# Patient Record
Sex: Female | Born: 1965 | Race: White | Hispanic: No | Smoking: Never smoker
Health system: Southern US, Community
[De-identification: ages and names within clinical notes are randomized; demographics above are authoritative.]

## PROBLEM LIST (undated history)

## (undated) DIAGNOSIS — T8859XA Other complications of anesthesia, initial encounter: Secondary | ICD-10-CM

## (undated) DIAGNOSIS — F32A Depression, unspecified: Secondary | ICD-10-CM

## (undated) DIAGNOSIS — N189 Chronic kidney disease, unspecified: Secondary | ICD-10-CM

## (undated) DIAGNOSIS — K219 Gastro-esophageal reflux disease without esophagitis: Secondary | ICD-10-CM

## (undated) DIAGNOSIS — Z8489 Family history of other specified conditions: Secondary | ICD-10-CM

## (undated) DIAGNOSIS — R011 Cardiac murmur, unspecified: Secondary | ICD-10-CM

## (undated) DIAGNOSIS — T4145XA Adverse effect of unspecified anesthetic, initial encounter: Secondary | ICD-10-CM

## (undated) DIAGNOSIS — C801 Malignant (primary) neoplasm, unspecified: Secondary | ICD-10-CM

## (undated) DIAGNOSIS — R51 Headache: Secondary | ICD-10-CM

## (undated) DIAGNOSIS — F329 Major depressive disorder, single episode, unspecified: Secondary | ICD-10-CM

## (undated) HISTORY — PX: BREAST SURGERY: SHX581

## (undated) HISTORY — PX: KIDNEY STONE SURGERY: SHX686

## (undated) HISTORY — PX: DIAGNOSTIC LAPAROSCOPY: SUR761

---

## 1999-07-21 ENCOUNTER — Other Ambulatory Visit: Admission: RE | Admit: 1999-07-21 | Discharge: 1999-07-21 | Payer: Self-pay | Admitting: Obstetrics and Gynecology

## 2000-09-04 ENCOUNTER — Other Ambulatory Visit: Admission: RE | Admit: 2000-09-04 | Discharge: 2000-09-04 | Payer: Self-pay | Admitting: Obstetrics and Gynecology

## 2001-10-09 ENCOUNTER — Other Ambulatory Visit: Admission: RE | Admit: 2001-10-09 | Discharge: 2001-10-09 | Payer: Self-pay | Admitting: Obstetrics and Gynecology

## 2001-10-17 ENCOUNTER — Encounter: Admission: RE | Admit: 2001-10-17 | Discharge: 2001-10-17 | Payer: Self-pay | Admitting: Obstetrics and Gynecology

## 2001-10-17 ENCOUNTER — Encounter: Payer: Self-pay | Admitting: Obstetrics and Gynecology

## 2001-12-14 ENCOUNTER — Encounter (INDEPENDENT_AMBULATORY_CARE_PROVIDER_SITE_OTHER): Payer: Self-pay | Admitting: Specialist

## 2001-12-14 ENCOUNTER — Ambulatory Visit (HOSPITAL_BASED_OUTPATIENT_CLINIC_OR_DEPARTMENT_OTHER): Admission: RE | Admit: 2001-12-14 | Discharge: 2001-12-14 | Payer: Self-pay | Admitting: *Deleted

## 2002-11-25 ENCOUNTER — Other Ambulatory Visit: Admission: RE | Admit: 2002-11-25 | Discharge: 2002-11-25 | Payer: Self-pay | Admitting: Obstetrics and Gynecology

## 2003-12-23 ENCOUNTER — Other Ambulatory Visit: Admission: RE | Admit: 2003-12-23 | Discharge: 2003-12-23 | Payer: Self-pay | Admitting: Obstetrics and Gynecology

## 2005-01-14 ENCOUNTER — Other Ambulatory Visit: Admission: RE | Admit: 2005-01-14 | Discharge: 2005-01-14 | Payer: Self-pay | Admitting: Obstetrics and Gynecology

## 2006-03-03 ENCOUNTER — Other Ambulatory Visit: Admission: RE | Admit: 2006-03-03 | Discharge: 2006-03-03 | Payer: Self-pay | Admitting: Obstetrics and Gynecology

## 2008-05-22 ENCOUNTER — Encounter: Admission: RE | Admit: 2008-05-22 | Discharge: 2008-05-22 | Payer: Self-pay | Admitting: Obstetrics and Gynecology

## 2008-12-25 ENCOUNTER — Encounter: Admission: RE | Admit: 2008-12-25 | Discharge: 2008-12-25 | Payer: Self-pay | Admitting: Obstetrics and Gynecology

## 2009-01-08 ENCOUNTER — Ambulatory Visit (HOSPITAL_COMMUNITY): Admission: RE | Admit: 2009-01-08 | Discharge: 2009-01-08 | Payer: Self-pay | Admitting: Obstetrics and Gynecology

## 2009-01-26 ENCOUNTER — Ambulatory Visit (HOSPITAL_COMMUNITY): Admission: RE | Admit: 2009-01-26 | Discharge: 2009-01-26 | Payer: Self-pay | Admitting: Urology

## 2009-05-28 ENCOUNTER — Encounter: Admission: RE | Admit: 2009-05-28 | Discharge: 2009-05-28 | Payer: Self-pay | Admitting: Obstetrics and Gynecology

## 2009-06-10 ENCOUNTER — Ambulatory Visit (HOSPITAL_COMMUNITY): Admission: RE | Admit: 2009-06-10 | Discharge: 2009-06-10 | Payer: Self-pay | Admitting: Obstetrics and Gynecology

## 2009-06-10 ENCOUNTER — Encounter (INDEPENDENT_AMBULATORY_CARE_PROVIDER_SITE_OTHER): Payer: Self-pay | Admitting: Obstetrics and Gynecology

## 2009-08-19 ENCOUNTER — Encounter: Payer: Self-pay | Admitting: Gastroenterology

## 2009-08-19 ENCOUNTER — Encounter: Payer: Self-pay | Admitting: Nurse Practitioner

## 2009-09-24 ENCOUNTER — Ambulatory Visit (HOSPITAL_BASED_OUTPATIENT_CLINIC_OR_DEPARTMENT_OTHER): Admission: RE | Admit: 2009-09-24 | Discharge: 2009-09-24 | Payer: Self-pay | Admitting: Urology

## 2010-01-07 ENCOUNTER — Encounter: Payer: Self-pay | Admitting: Nurse Practitioner

## 2010-01-14 ENCOUNTER — Encounter: Payer: Self-pay | Admitting: Nurse Practitioner

## 2010-01-14 ENCOUNTER — Telehealth: Payer: Self-pay | Admitting: Internal Medicine

## 2010-01-15 ENCOUNTER — Ambulatory Visit: Payer: Self-pay | Admitting: Gastroenterology

## 2010-01-15 DIAGNOSIS — R142 Eructation: Secondary | ICD-10-CM

## 2010-01-15 DIAGNOSIS — R1012 Left upper quadrant pain: Secondary | ICD-10-CM

## 2010-01-15 DIAGNOSIS — R143 Flatulence: Secondary | ICD-10-CM

## 2010-01-15 DIAGNOSIS — R141 Gas pain: Secondary | ICD-10-CM

## 2010-01-20 ENCOUNTER — Ambulatory Visit: Payer: Self-pay | Admitting: Gastroenterology

## 2010-01-22 ENCOUNTER — Encounter: Payer: Self-pay | Admitting: Gastroenterology

## 2010-02-17 ENCOUNTER — Ambulatory Visit: Payer: Self-pay | Admitting: Gastroenterology

## 2010-05-06 IMAGING — MG MM DIAGNOSTIC UNILATERAL R
4 series · 4 of 4 positions shown · non-contrast
Comparison: Mammogram March 26, 2007,May 12, 2008 and May 23, 2019
[id number]

CLINICAL DATA: 6-month follow-up of calcifications in the right
breast

DIGITAL DIAGNOSTIC RIGHT MAMMOGRAM WITH CAD

[R CC (1 of 2)]
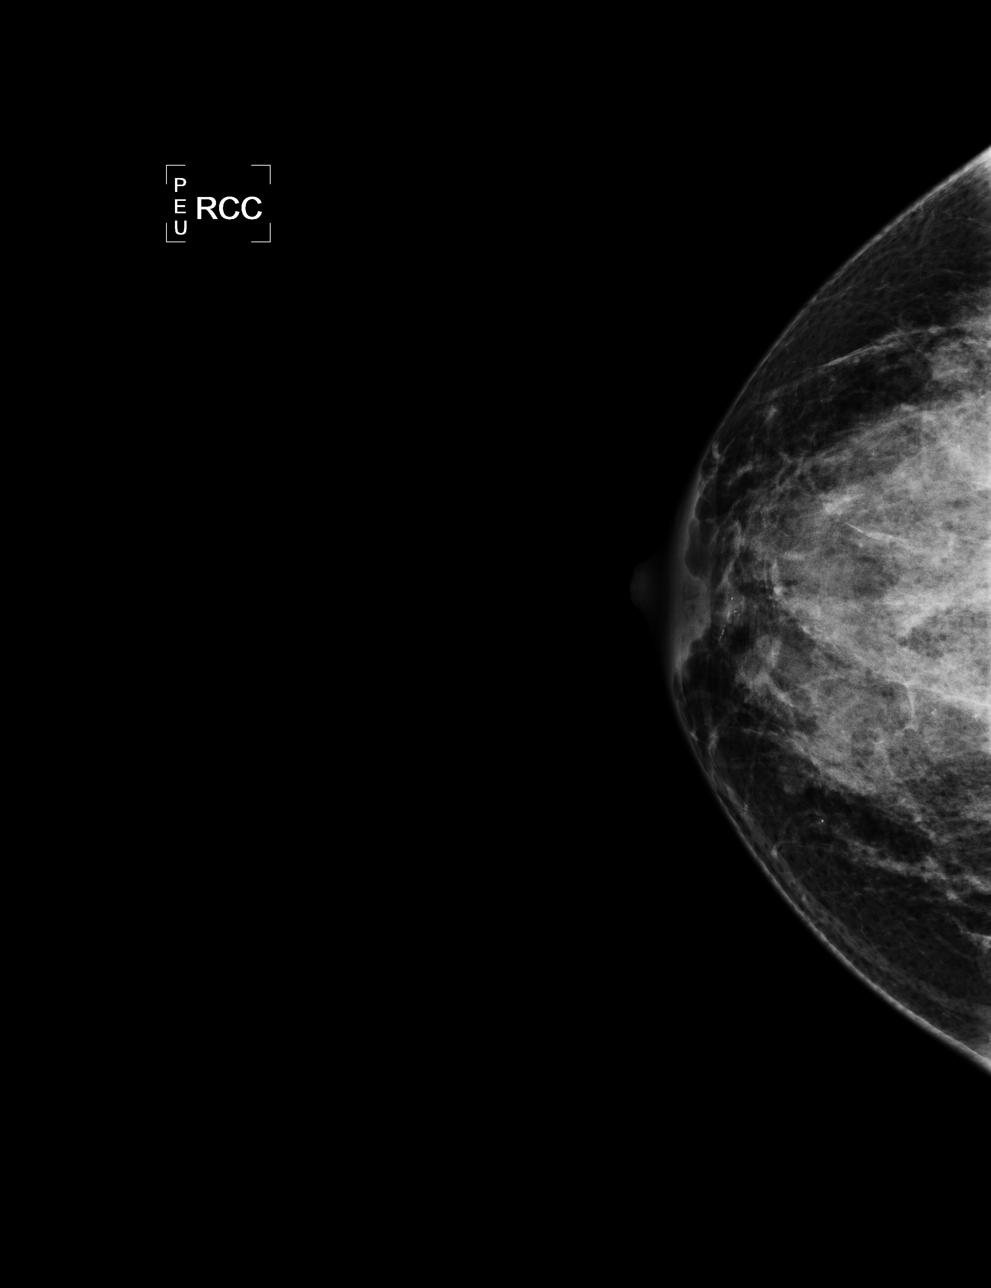

[R MLO]
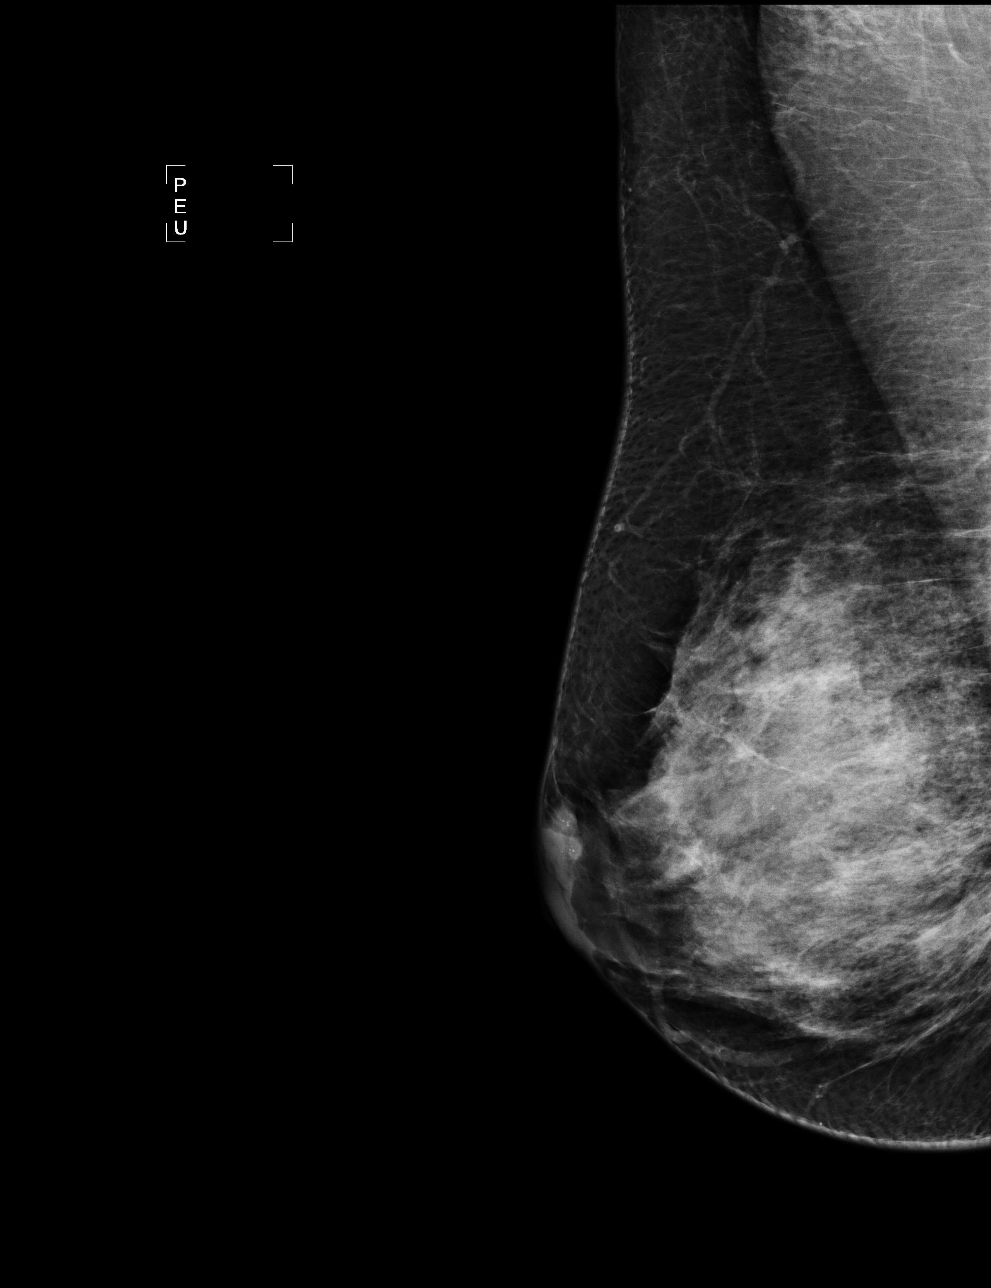

[R CC (2 of 2)]
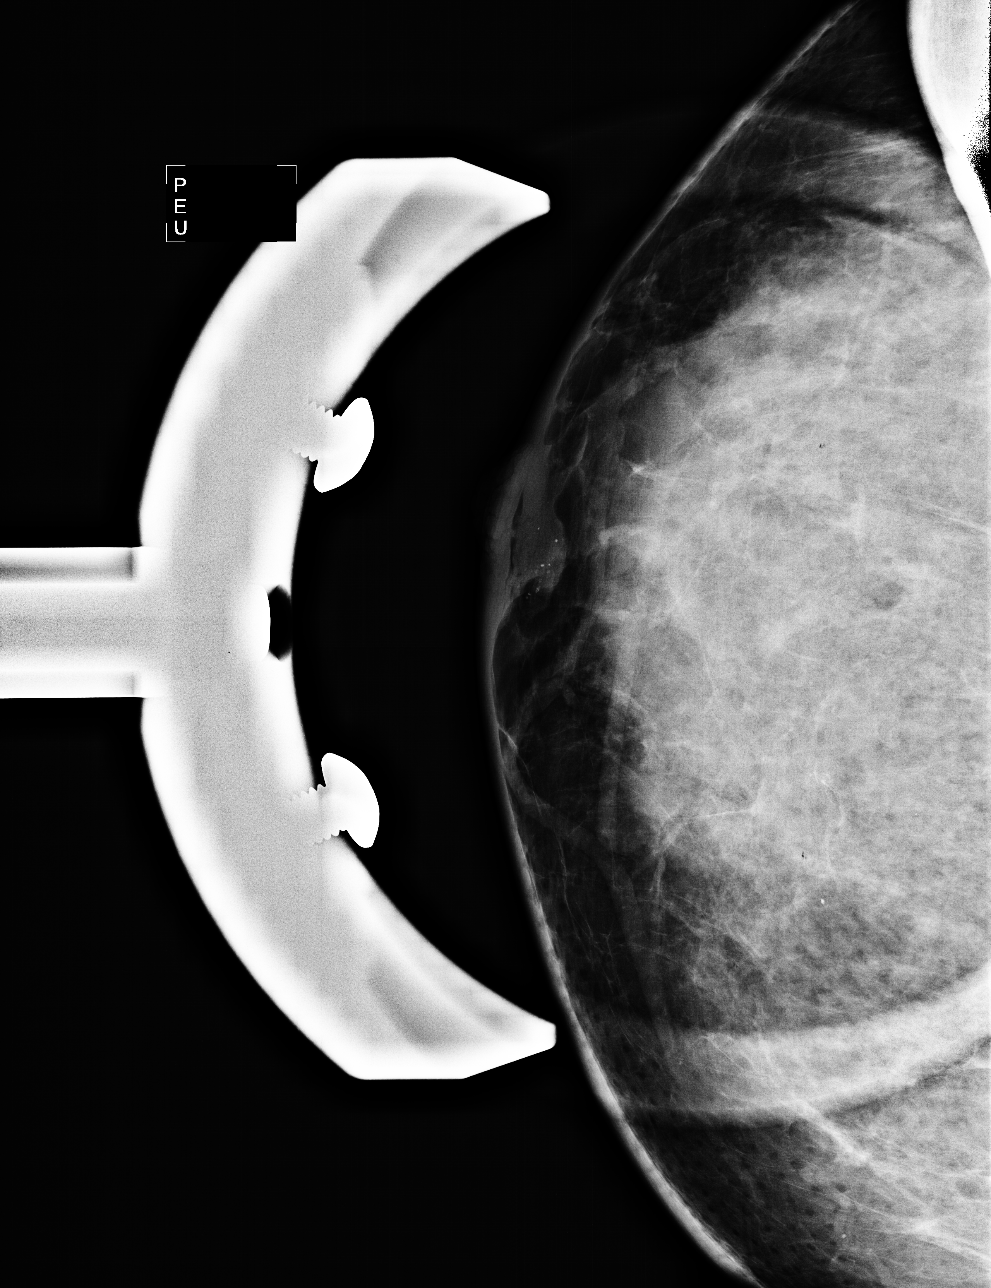

[R ML]
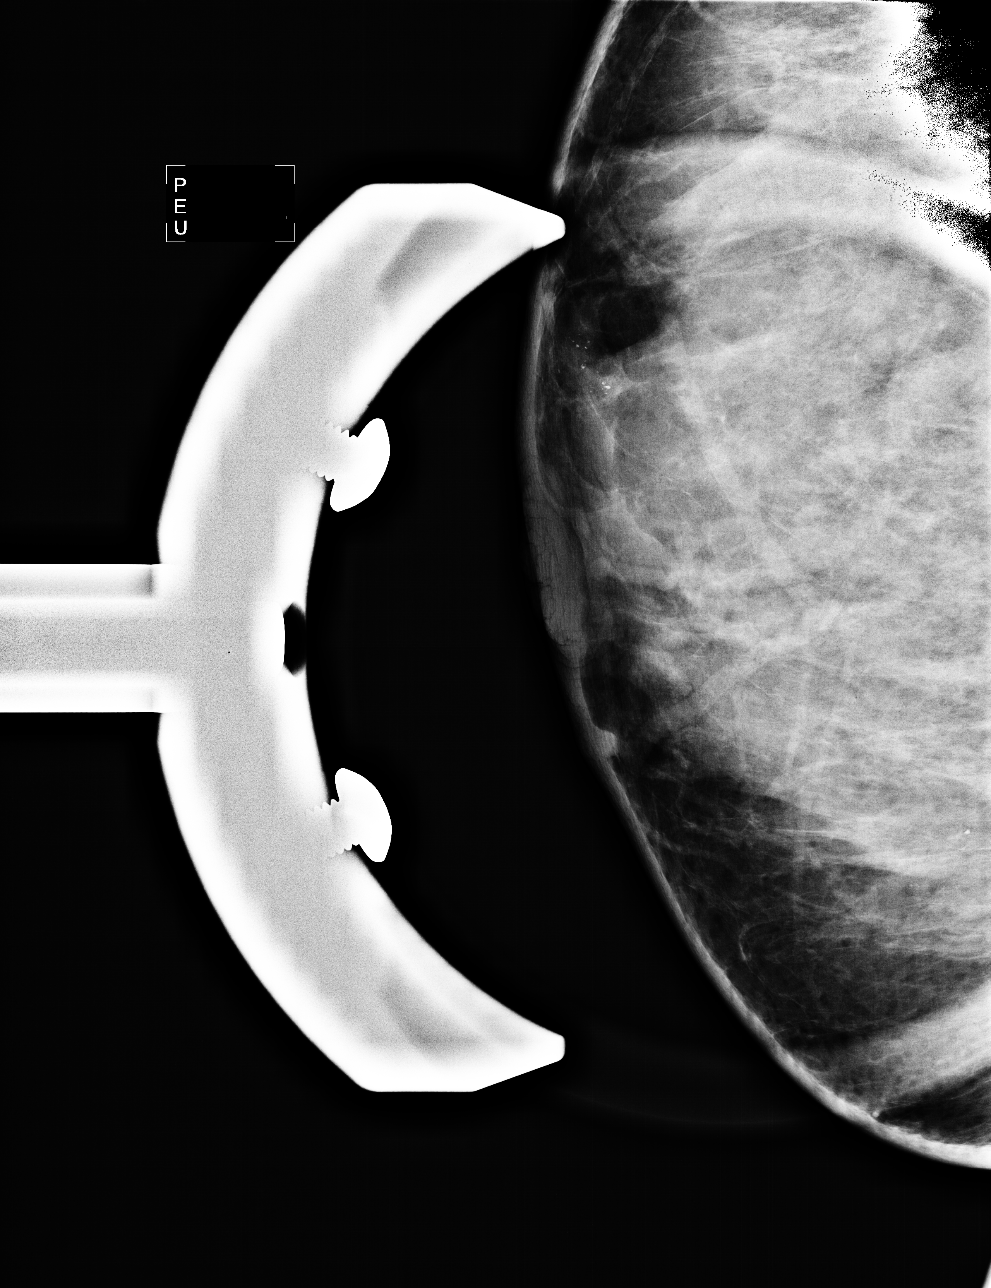

[4 of 4 positions shown; findings below may reference images not displayed]

FINDINGS: Whole breast CC and MLO views of the right breast show
extremely dense breast parenchyma without evidence of mass or
architectural distortion.  Calcifications in the subareolar right
breast are evaluated with spot magnification views and are stable
in appearance and number.  These calcifications are coarse and some
of them have lucent centers.
IMPRESSION: Stable probably benign calcifications in the subareolar right
breast. Suggest bilateral diagnostic mammogram in May 2009 with
magnification views of the right breast to complete a full year
follow-up.

BI-RADS CATEGORY 3:  Probably benign finding(s) - short interval
follow-up suggested.

REF:Z9 DICTATED: 12/25/2008 [DATE]

## 2010-12-16 NOTE — Progress Notes (Signed)
Summary: Triage  Phone Note From Other Clinic   Caller: [facility] Dr. Clelia Croft  [phone] Summary of Call: Pt has Dyspepsia and Dr. Clelia Croft is requesting Dr. Juanda Chance and she does not have any appts. available Initial call taken by: Karna Christmas,  January 14, 2010 12:39 PM  Follow-up for Phone Call        Patient  is scheduled to see Willette Cluster RNP 01-15-10 2:30.  Malachi Bonds will send notes and notify the patient Follow-up by: Darcey Nora RN, CGRN,  January 14, 2010 2:52 PM

## 2010-12-16 NOTE — Procedures (Signed)
Summary: Upper Endoscopy  Patient: [name] Note: All result statuses are Final unless otherwise noted.  Tests: (1) Upper Endoscopy (EGD)   EGD Upper Endoscopy       DONE     [facility]     [address]     [place], [place]  [address]           ENDOSCOPY PROCEDURE REPORT           PATIENT:  [name], [name]  MR#:  [id number]     BIRTHDATE:  [birth year], 43 yrs. old  GENDER:  female           ENDOSCOPIST:  Barbette Hair. Arlyce Dice, MD     Referred by:           PROCEDURE DATE:  01/20/2010     PROCEDURE:  EGD with biopsy     ASA CLASS:  Class I     INDICATIONS:  abdominal pain           MEDICATIONS:   Fentanyl 50 mcg IV, Versed 5 mg IV, Benadryl 25 mg     IV, glycopyrrolate (Robinal) 0.2 mg IV, 0.6cc simethancone 0.6 cc     PO     TOPICAL ANESTHETIC:  Exactacain Spray           DESCRIPTION OF PROCEDURE:   After the risks benefits and     alternatives of the procedure were thoroughly explained, informed     consent was obtained.  The LB GIF-H180 [id number] endoscope was     introduced through the mouth and advanced to the third portion of     the duodenum, without limitations.  The instrument was slowly     withdrawn as the mucosa was fully examined.     <<PROCEDUREIMAGES>>           Duodenitis was found. Enlarged folds in bulb. Bxs taken (see     image3).  Mild gastritis was found at the gastroesophageal     junction. Edematous mucosa gastric side of GE junction. Bxs taken     (see image9).  A sessile polyp was found in the fundus (see     image7). Single 4mm sessile polyp with normal appearing mucosa     Otherwise the examination was normal (see image1, image4, image5,     image6, and image12).    Retroflexed views revealed no     abnormalities.    The scope was then withdrawn from the patient     and the procedure completed.           COMPLICATIONS:  None           ENDOSCOPIC IMPRESSION:     1) Duodenitis     2) Mild gastritis at the gastroesophageal junction     3)  Sessile polyp in the fundus     4) Otherwise normal examination           Findings are likely incidental to complaints of abdominal pain,     which appears musculoskeletal           RECOMMENDATIONS:     1) Await biopsy results     2) continue PPI - nexium     3) trial of clinoril 200mg  bid for 10 days     3) Call office next 2-3 days to schedule an office appointment     for 2 weeks           REPEAT EXAM:  No  ______________________________     Barbette Hair Arlyce Dice, MD           CC:  Kari Baars, MD           n.     eSIGNED:   Barbette Hair. Kaplan at 01/20/2010 11:28 AM           Toula Moos, [id number]  Note: An exclamation mark (!) indicates a result that was not dispersed into the flowsheet. Document Creation Date: 01/20/2010 11:28 AM _______________________________________________________________________  (1) Order result status: Final Collection or observation date-time: 01/20/2010 11:19 Requested date-time:  Receipt date-time:  Reported date-time:  Referring Physician:   Ordering Physician: Melvia Heaps [phone]) Specimen Source:  Source: Launa Grill Order Number: [id number] Lab site:

## 2010-12-16 NOTE — Assessment & Plan Note (Signed)
Summary: dyspepsia/[name]   History of Present Illness Visit Type: consult  Primary GI MD: Melvia Heaps MD [facility] Primary Provider: Buren Kos, MD Requesting Provider: Buren Kos, MD Chief Complaint: Left upper side pain, belching, and bloating  History of Present Illness:   New to this practice. Presents with one year history of pressure under left rib cage which can sometimes can be quite painful. Aware of discomfort at all time, it is not exacerbated by meals, worse when stationary for prolonged periods of time. She has recently developed problems with excessive belching. Patient has history of nephrolithiasis, she originally thought discomfort secondary to that. Underwent lithotripsy twice, last time in November. Still has discomfort and urology recommended she see her PCP and also consider GI evaluation. Recent CXR and labs were okay. No nausea, weight stable.    GI Review of Systems    Reports abdominal pain, belching, and  bloating.     Location of  Abdominal pain: left side.    Denies acid reflux, chest pain, dysphagia with liquids, dysphagia with solids, heartburn, loss of appetite, nausea, vomiting, vomiting blood, weight loss, and  weight gain.        Denies anal fissure, black tarry stools, change in bowel habit, constipation, diarrhea, diverticulosis, fecal incontinence, heme positive stool, hemorrhoids, irritable bowel syndrome, jaundice, light color stool, liver problems, rectal bleeding, and  rectal pain.    Current Medications (verified): 1)  Multivitamins   Tabs (Multiple Vitamin) .... One Tablet By Mouth Once Daily 2)  Calcium 1500 Mg Tabs (Calcium Carbonate) .... One Tablet By Mouth Once Daily 3)  Vicodin 5-500 Mg Tabs (Hydrocodone-Acetaminophen) .... One Tablet By Mouth As Needed For Kidney Stones Pain 4)  Oxycodone-Acetaminophen 5-325 Mg Tabs (Oxycodone-Acetaminophen) .... One Tablet By Mouth As Needed For Kidney Stones Pain  Allergies (verified): 1)  ! Sulfa 2)   ! Macrobid 3)  ! Aspirin 4)  ! Flomax  Past History:  Past Medical History: Chronic Headaches Kidney Stones  Past Surgical History: Bilateral Breast Biopsy Kidney Stone Surgery  Family History: Family History of Colon Polyps:Mother  Family History of Colon Cancer: ? 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  Alcohol Use - no Daily Caffeine Use: 1/2 daily  Illicit Drug Use - no Smoking Status:  never Drug Use:  no  Review of Systems       The patient complains of back pain, fatigue, headaches-new, and sleeping problems.  The patient denies allergy/sinus, anemia, anxiety-new, arthritis/joint pain, blood in urine, breast changes/lumps, change in vision, confusion, cough, coughing up blood, depression-new, fainting, fever, hearing problems, heart murmur, heart rhythm changes, itching, menstrual pain, muscle pains/cramps, night sweats, nosebleeds, pregnancy symptoms, shortness of breath, skin rash, sore throat, swelling of feet/legs, swollen lymph glands, thirst - excessive , urination - excessive , urination changes/pain, urine leakage, vision changes, and voice change.    Vital Signs:  Patient profile:   45 year old female Height:      64 inches Weight:      158 pounds BMI:     27.22 BSA:     1.77 Pulse rate:   72 / minute Pulse rhythm:   regular BP sitting:   124 / 80  (left arm) Cuff size:   regular  Vitals Entered By: Ok Anis CMA (January 15, 2010 2:22 PM)  Physical Exam  General:  Well developed, well nourished, no acute distress. Head:  Normocephalic and atraumatic. Eyes:  Conjunctiva pink, no icterus.  Mouth:  No oral lesions. Tongue moist.  Neck:  no obvious masses  Lungs:  Clear throughout to auscultation. Heart:  Regular rate and rhythm; no murmurs, rubs,  or bruits. Abdomen:  Abdomen soft, nontender, nondistended. No obvious masses or  hepatomegaly.Normal bowel sounds.  Msk:  Symmetrical with no gross deformities. Normal posture. Extremities:  No palmar erythema, no edema.  Neurologic:  Alert and  oriented x4;  grossly normal neurologically. Skin:  Intact without significant lesions or rashes. Cervical Nodes:  No significant cervical adenopathy. Psych:  Alert and cooperative. Normal mood and affect.   Impression & Recommendations:  Problem # 1:  LUQ PAIN (ICD-789.02) One year history of LUQ pressure, present almost on a constant basis. She has recently developed problems with belching as well. Her discomfort doesn't seem GI related. Sounds more musculoskeletal, neuropathic in nature but she has suffered with it for a year now. Belching may or may not be related to LUQ discomfort.  For further evaluation the patient will be scheduled for an EGD with biopsies ( if indicated).  The risks and benefits of the procedure, as well as alternatives were discussed with the patient and she agrees to proceed. Trial of a daily PPI. If EGD negative, consider imaging.    Orders: EGD (EGD)  Patient Instructions: 1)  We have given you Nexium samples. Take 1 capsule 30 min prior to breakfast. 2)  We scheduled the Endoscopy for 01-20-10 with Dr. Arlyce Dice.  3)  [facility] Patient Information Guide given to patient. 4)  Endoscopy brochure provided. 5)  Copy sent to : Buren Kos, MD 6)  The medication list was reviewed and reconciled.  All changed / newly prescribed medications were explained.  A complete medication list was provided to the patient / caregiver.

## 2010-12-16 NOTE — Letter (Signed)
Summary: [facility]  [facility]   Imported By: Lester [place] 02/09/2010 11:36:50  _____________________________________________________________________  External Attachment:    Type:   Image     Comment:   External Document

## 2010-12-16 NOTE — Letter (Signed)
Summary: Results Letter  [facility]  [address] [place], [place] [address]   Phone: [phone]  Fax: [phone]        January 22, 2010 MRN: [id number]    AHMYAH GIDLEY [address] [facility] [address], [place]  [address]    Dear Ms. Andre,   Your biopsies demonstrated inflammatory changes only.    Please follow the recommendations previously discussed.  Should you have any immediate concerns or questions, feel free to contact me at the office.    Sincerely,  Barbette Hair. Arlyce Dice, M.D., [facility]          Sincerely,  Louis Meckel MD  This letter has been electronically signed by your physician.  Appended Document: Results Letter Letter mailed 3.15.11

## 2010-12-16 NOTE — Assessment & Plan Note (Signed)
Summary: F/U EGD--[facility].   History of Present Illness Visit Type: Follow-up Visit Primary GI MD: Melvia Heaps MD [facility] Primary Provider: Buren Kos, MD Requesting Provider: n/a Chief Complaint: F/u from EGD. Pt c/o belching  History of Present Illness:   Ms. Apsey has returned on upper endoscopy.  This demonstrated mild duodenitis.  She was placed on Clinoril with the thought that her pain in the left upper quadrant is due to  musculoskeletal pain.  Clinoril did not  improve the discomfort.  She continues to have pressure--like discomfort in the area of her left ribs.  It is not affected by eating.  She also complains of excessive belching.  She does drink a large amount of liquid throughout the day because of her kidney stones.   GI Review of Systems    Reports belching.      Denies abdominal pain, acid reflux, bloating, chest pain, dysphagia with liquids, dysphagia with solids, heartburn, loss of appetite, nausea, vomiting, vomiting blood, weight loss, and  weight gain.        Denies anal fissure, black tarry stools, change in bowel habit, constipation, diarrhea, diverticulosis, fecal incontinence, heme positive stool, hemorrhoids, irritable bowel syndrome, jaundice, light color stool, liver problems, rectal bleeding, and  rectal pain.    Current Medications (verified): 1)  Multivitamins   Tabs (Multiple Vitamin) .... One Tablet By Mouth Once Daily 2)  Calcium 1500 Mg Tabs (Calcium Carbonate) .... One Tablet By Mouth Once Daily 3)  Vicodin 5-500 Mg Tabs (Hydrocodone-Acetaminophen) .... One Tablet By Mouth As Needed For Kidney Stones Pain 4)  Oxycodone-Acetaminophen 5-325 Mg Tabs (Oxycodone-Acetaminophen) .... One Tablet By Mouth As Needed For Kidney Stones Pain 5)  Nexium 40 Mg Cpdr (Esomeprazole Magnesium) .... Take 1 Cap 30 Min Prior To Breakfast 6)  Clinoril 200 Mg Tabs (Sulindac) .... Take 1 Tab Two Times A Day For 10 Days, Then As Needed  Allergies (verified): 1)  ! Sulfa 2)  !  Macrobid 3)  ! Aspirin 4)  ! Flomax  Past History:  Past Medical History: Reviewed history from 01/15/2010 and no changes required. Chronic Headaches Kidney Stones  Past Surgical History: Reviewed history from 01/15/2010 and no changes required. Bilateral Breast Biopsy Kidney Stone Surgery  Family History: Reviewed history from 01/15/2010 and no changes required. Family History of Colon Polyps:Mother  Family History of Colon Cancer: ? MGF  Family History of Irritable Bowel Syndrome:Mother   Social History: Reviewed history from 01/15/2010 and no changes required. 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  Alcohol Use - no Daily Caffeine Use: 1/2 daily  Illicit Drug Use - no  Review of Systems       The patient complains of back pain and urination changes/pain.  The patient denies allergy/sinus, anemia, anxiety-new, arthritis/joint pain, blood in urine, breast changes/lumps, change in vision, confusion, cough, coughing up blood, depression-new, fainting, fatigue, fever, headaches-new, hearing problems, heart murmur, heart rhythm changes, itching, menstrual pain, muscle pains/cramps, night sweats, nosebleeds, pregnancy symptoms, shortness of breath, skin rash, sleeping problems, sore throat, swelling of feet/legs, swollen lymph glands, thirst - excessive , urination - excessive , urine leakage, vision changes, and voice change.    Vital Signs:  Patient profile:   45 year old female Height:      64 inches Weight:      155 pounds BMI:     26.70 BSA:     1.76 Pulse rate:   74 / minute Pulse rhythm:   regular BP  sitting:   122 / 76  (left arm) Cuff size:   regular  Vitals Entered By: Ok Anis CMA (February 17, 2010 3:34 PM)   Impression & Recommendations:  Problem # 1:  LUQ PAIN (ICD-789.02) I strongly suspect that her pain is musculoskeletal pain.  While she has mild duodenitis I do not think that this is an active problem at this  point.  Recommendations #1 no further GI studies are  #2 complete four-week course of Nexium #3 patient will return to GI on an as-needed basis  Problem # 2:  BELCHING (ICD-787.3) This may be related to aerophagia secondary to her large intake of liquids.  Recommendations #1 simethicone or gas Gas-X  Patient Instructions: 1)  CC Dr. Eric Form 2)  The medication list was reviewed and reconciled.  All changed / newly prescribed medications were explained.  A complete medication list was provided to the patient / caregiver.

## 2010-12-16 NOTE — Letter (Signed)
Summary: EGD Instructions  [facility]  [address] [place], [place] [address]   Phone: [phone]  Fax: [phone]       [name]    01-04-1966    MRN: [id number]       Procedure Day /Date:01-20-10     Arrival Time: 9:30 AM     Procedure Time: 10:30 AM     Location of Procedure:                    X    [facility] (4th Floor)  PREPARATION FOR ENDOSCOPY   On  01-20-10  THE DAY OF THE PROCEDURE:  1.   No solid foods, milk or milk products are allowed after midnight the night before your procedure.  2.   Do not drink anything colored red or purple.  Avoid juices with pulp.  No orange juice.  3.  You may drink clear liquids until 8:30 AM  which is 2 hours before your procedure.                                                                                                CLEAR LIQUIDS INCLUDE: Water Jello Ice Popsicles Tea (sugar ok, no milk/cream) Powdered fruit flavored drinks Coffee (sugar ok, no milk/cream) Gatorade Juice: apple, white grape, white cranberry  Lemonade Clear bullion, consomm, broth Carbonated beverages (any kind) Strained chicken noodle soup Hard Candy   MEDICATION INSTRUCTIONS  Unless otherwise instructed, you should take regular prescription medications with a small sip of water as early as possible the morning of your procedure.         OTHER INSTRUCTIONS  You will need a responsible adult at least 44 years of age to accompany you and drive you home.   This person must remain in the waiting room during your procedure.  Wear loose fitting clothing that is easily removed.  Leave jewelry and other valuables at home.  However, you may wish to bring a book to read or an iPod/MP3 player to listen to music as you wait for your procedure to start.  Remove all body piercing jewelry and leave at home.  Total time from sign-in until discharge is approximately 2-3 hours.  You should go home directly after your  procedure and rest.  You can resume normal activities the day after your procedure.  The day of your procedure you should not:   Drive   Make legal decisions   Operate machinery   Drink alcohol   Return to work  You will receive specific instructions about eating, activities and medications before you leave.    The above instructions have been reviewed and explained to me by   _______________________    I fully understand and can verbalize these instructions _____________________________ Date _________

## 2010-12-16 NOTE — Miscellaneous (Signed)
  Clinical Lists Changes  Medications: Added new medication of CLINORIL 200 MG TABS (SULINDAC) take 1 tab two times a day for 10 days, then as needed - Signed Rx of CLINORIL 200 MG TABS (SULINDAC) take 1 tab two times a day for 10 days, then as needed;  #30 x 1;  Signed;  Entered by: Louis Meckel MD;  Authorized by: Louis Meckel MD;  Method used: [facility] to [facility] [phone]*, [address], [place], [place], [place]  [address], Ph: [phone] or [phone], Fax: [phone]    Prescriptions: CLINORIL 200 MG TABS (SULINDAC) take 1 tab two times a day for 10 days, then as needed  #30 x 1   Entered and Authorized by:   Louis Meckel MD   Signed by:   Louis Meckel MD on 01/20/2010   Method used:   [facility] to        [facility] [phone]* (retail)       [address]       [place], [place]  [address]       Ph: [phone] or [phone]       Fax: [phone]   RxID:   [id number]

## 2011-02-16 LAB — POCT PREGNANCY, URINE: Preg Test, Ur: NEGATIVE

## 2011-02-20 LAB — HCG, SERUM, QUALITATIVE: Preg, Serum: NEGATIVE

## 2011-02-20 LAB — CBC
MCHC: 34.3 g/dL (ref 30.0–36.0)
RDW: 12.7 % (ref 11.5–15.5)

## 2011-02-24 LAB — BASIC METABOLIC PANEL
CO2: 26 mEq/L (ref 19–32)
Calcium: 8.8 mg/dL (ref 8.4–10.5)
Creatinine, Ser: 0.71 mg/dL (ref 0.4–1.2)
GFR calc Af Amer: >60 mL/min (ref >60)

## 2011-02-24 LAB — HEMOGLOBIN AND HEMATOCRIT, BLOOD
HCT: 37.5 % (ref 36.0–46.0)
Hemoglobin: 12.7 g/dL (ref 12.0–15.0)

## 2011-03-29 NOTE — Op Note (Signed)
[name], [name]                 ACCOUNT NO.:  [id number]   MEDICAL RECORD NO.:  [id number]          PATIENT TYPE:  AMB   LOCATION:  DAY                          FACILITY:  [facility]   PHYSICIAN:  Heloise Purpura, MD      DATE OF BIRTH:  July 22, 1966   DATE OF PROCEDURE:  01/26/2009  DATE OF DISCHARGE:                               OPERATIVE REPORT   PREOPERATIVE DIAGNOSES:  Left renal and ureteral calculi.   POSTOPERATIVE DIAGNOSES:  Left renal and ureteral calculi.   PROCEDURES:  1. Cystoscopy.  2. Left retrograde pyelography.  3. Left ureteroscopy with laser lithotripsy and stone removal.  4. Left ureteral stent placement (6 x 24).   SURGEON:  Dr. Heloise Purpura.   ANESTHESIA:  LMA.   COMPLICATIONS:  None.   INDICATIONS:  [name] is a 45 year old female who presented with left-  sided flank pain.  She was found to have a left ureteral calculus along  with bilateral nonobstructing renal calculi on her initial evaluation.  She underwent an initial period of observation, but continued to have  pain and discomfort.  On reevaluation, her stone was noted to have moved  into the distal ureter.  After discussion regarding management options  for treatment, she elected to proceed with a ureteroscopic approach to  her calculi.  We also discussed treating her left renal calculus if  treatment of her left ureteral calculus went smoothly.  The potential  risks, complications and alternative options associated with the above  procedures were discussed in detail and informed consent was obtained.   DESCRIPTION OF PROCEDURE:  The patient was taken to the operating room  and a general anesthetic was administered.  She was given preoperative  antibiotics, placed in the dorsal lithotomy position, and prepped and  draped in the usual sterile fashion.  Next, a preoperative timeout was  performed.  Cystourethroscopy was then performed which demonstrated a  normal urethra and bladder.  Systematic  examination of the bladder  revealed no evidence of any bladder tumors, stones, or other mucosal  pathology.  The ureteral orifices were in the expected anatomic  positions.  The left ureteral orifice was then cannulated with a 6-  [place] ureteral catheter and Omnipaque contrast was injected.  There was  noted to be a density in the distal left ureter and this was confirmed  to represent the patient's stone on retrograde contrast administration.  A 0.038 sensor guidewire was then advanced up into the left renal pelvis  under fluoroscopic guidance.  This was able to be placed up past the  stone without difficulty.  A semi-rigid ureteroscope was then advanced  up next to the wire and the ureteral stone was visualized.  Using a 200  micron fiber, the holmium laser was used to fragment the stone.  Once  the stone was fragmented into appropriate size fragments, the engage  basket was used to remove all fragments from the ureter.  Further  ureteroscopy revealed that all stones were removed from the ureter.  A  12/14 ureteral access sheath was then advanced over the wire  under  fluoroscopic guidance up into the proximal ureter.  The digital flexible  ureteroscope was then advanced up into the kidney which was inspected.  There was two calculi identified within the renal pelvis, including one  in the lower pole and one in the upper pole.  These were both small  enough that they were able to be removed via the engage basket without  fragmentation necessary.  A 0.038 guidewire was then advanced back up  the ureteral access sheath into the renal pelvis and the ureteral access  sheath was removed.  The wire was back loaded over the cystoscope and a  6 x 24 double-J ureteral stent was then advanced over the wire using  Seldinger technique and appropriately positioned.  The wire was removed  with a good curl noted in the renal pelvis, as well as in the bladder.  The patient's bladder was emptied and the  procedure was ended.  A string  tether was left on the stent for removal by the patient at a later date.  She tolerated the procedure well without complications and was able to  be transferred to the recovery unit after awakening from anesthesia.      Heloise Purpura, MD  Electronically Signed     LB/MEDQ  D:  01/26/2009  T:  01/26/2009  Job:  [id number]

## 2011-03-29 NOTE — H&P (Signed)
[name], HARDGE                 ACCOUNT NO.:  [id number]   MEDICAL RECORD NO.:  [id number]          PATIENT TYPE:  AMB   LOCATION:  SDC                           FACILITY:  [facility]   PHYSICIAN:  Guy Sandifer. Henderson Cloud, M.D. DATE OF BIRTH:  06-07-1966   DATE OF ADMISSION:  06/10/2009  DATE OF DISCHARGE:                              HISTORY & PHYSICAL   CHIEF COMPLAINT:  Left lower quadrant pain.   HISTORY OF PRESENT ILLNESS:  This patient is a 45 year old divorced  white female, G1, P1, who is currently abstinent.  She has persistent  left lower quadrant pain.  She has tried the birth control pill, but  feels dysphoric on that.  Abdominopelvic CT in February of this year  revealed kidney stones on the left and otherwise normal pelvis with the  exception of a less than 1-cm uterine fibroid.  She was followed up by  Urology.  The kidney stone pain has stopped.  She is no longer passing  kidney stones, but the left lower quadrant pain continues.  Pelvic  ultrasound in June 2008 in my office revealed a uterus measuring 8.6 x  3.7 x 5.7 cm with 16 and 14 mm intramural fibroids.  After discussion of  options, she is being admitted for laparoscopy with possible left  salpingo-oophorectomy.  Potential risks and complications have been  discussed preoperatively.   PAST MEDICAL HISTORY:  1. History of abnormal Pap.  2. History of migraine headaches.  3. History of UTI.  4. History of kidney stones.   PAST SURGICAL HISTORY:  Bilateral breast biopsy in 2002 and kidney stone  surgery in 2010.   OBSTETRICAL HISTORY:  Vaginal delivery x1.   FAMILY HISTORY:  Positive for headache, heart disease, asthma, irritable  bowel syndrome, thyroid disease, urinary tract infection, arthritis,  diabetes, hypertension, and cancer.   MEDICATIONS:  Potassium citrate 2 times a day.   ALLERGIES:  ASPIRIN leading to stomach upset (Aleve is okay), SULFA  leading to hives and MACROBID leading to heart racing and  shortness of  breath.   SOCIAL HISTORY:  Denies tobacco, alcohol, or drug abuse.   REVIEW OF SYSTEMS:  Neurologic:  Headache as above.  Cardiac:  Denies  chest pain.  Pulmonary:  Denies shortness of breath.   PHYSICAL EXAMINATION:  VITAL SIGNS:  Height 5 feet 4 inches, weight  160.6 pounds, and blood pressure 110/74.  LUNGS:  Clear to auscultation.  HEART:  Regular rate and rhythm.  ABDOMEN:  Soft, nontender without masses.  BACK:  No CVA tenderness.  PELVIC:  Both vagina and cervix without lesion.  Uterus, upper, normal  size, mobile, nontender.  Right adnexa nontender without masses.  Left  adnexa mild tenderness without palpable masses.  EXTREMITIES:  Grossly within normal limits.  NEUROLOGICAL:  Grossly within normal limits.   ASSESSMENT:  Left lower quadrant pain.   PLAN:  Laparoscopy, possible left salpingo-oophorectomy.      Guy Sandifer Henderson Cloud, M.D.  Electronically Signed     JET/MEDQ  D:  05/29/2009  T:  05/30/2009  Job:  [id number]

## 2011-03-29 NOTE — Op Note (Signed)
[name], [name]                 ACCOUNT NO.:  [id number]   MEDICAL RECORD NO.:  [id number]          PATIENT TYPE:  AMB   LOCATION:  SDC                           [facility]ILITY:  [facility]   PHYSICIAN:  Guy Sandifer. Henderson Cloud, M.D. DATE OF BIRTH:  24-Jan-1966   DATE OF PROCEDURE:  06/10/2009  DATE OF DISCHARGE:                               OPERATIVE REPORT   PREOPERATIVE DIAGNOSIS:  Left lower quadrant pain.   POSTOPERATIVE DIAGNOSIS:  Endometriosis.   PROCEDURE:  Laparoscopy with resection and ablation of endometriosis.   SURGEON:  Guy Sandifer. Henderson Cloud, MD   ANESTHESIA:  General with endotracheal intubation.   SPECIMENS:  Biopsy of the right pelvis and biopsy of the right cul-de-  sac, both to Pathology.   ESTIMATED BLOOD LOSS:  Minimal.   INDICATIONS AND CONSENT:  The patient is a 45 year old divorced white  female G1, P1, she is not sexually active.  She has persistent left  lower quadrant pain.  Details are dictated in the history and physical.  Laparoscopy with possible left salpingo-oophorectomy has been discussed  preoperatively.  Potential risks and complications have been discussed  preoperatively including but limited to infection, organ damage,  bleeding requiring transfusion of blood products with HIV and hepatitis  acquisition, DVT, PE, pneumonia, negative laparoscopy, laparotomy,  recurrent pain.  All questions were answered and consent was signed on  the chart.   FINDINGS:  Upper abdomen is grossly normal.  Uterus has a 2-3 cm  intramural fibroid on the posterior left fundus.  Anterior cul-de-sac is  normal.  Tubes and ovaries are normal bilaterally.  Left pelvic sidewall  is normal.  The right pelvic sidewall, there is a single 1.5 cm  pedunculated process on the peritoneum.  In the right cul-de-sac, on the  more distal end of the uterosacral ligament, 2 implants approximately 8  mm a piece of dark blue endometriosis.   PROCEDURE:  The patient was taken to operating room  where she was  identified, placed in dorsal supine position and general anesthesia was  induced via endotracheal intubation.  She was then placed in dorsal  lithotomy position where she was prepped abdominally and vaginally and  bladder straight catheterized.  Hulka tenaculum was placed in the uterus  as a manipulator and she was draped as sterile fashion.  The patient had  also been marked in the left lower quadrant preoperatively.  The  infraumbilical and suprapubic areas were injected with 1.5 % plain  Marcaine, a total of 5 mL.  A small infraumbilical incision was made and  a disposable Veress needle was placed on the first attempt without  difficulty.  Placement verified with syringe and drop test.  Two liters  of gas were then insufflated under low pressure with good tympany in the  right upper quadrant.  Veress needle was removed and a 10/11 Xcel  bladeless disposable trocar sleeve was placed using direct visualization  with a diagnostic laparoscopic.  After placement, the operative  laparoscope was used.  A small suprapubic incision was made in the  midline and a 5-mm Xcel bladeless disposable  trocar sleeve was placed  under direct visualization without difficulty.  The above findings were  noted.  The peritoneal process on the right pelvic sidewall was removed  with simple resection.  The implants in the posterior cul-de-sac were  biopsied.  While biopsying the first, the implant essentially  disintegrates and the peritoneal edges were cauterized.  The second  specimen was removed, keeping the implant more intact and sent to  Pathology.  Again hemostasis was maintained with cautery at peritoneal  edges.  This was well clear of the course with the ureter.  Intercede  was then back loaded through the laparoscope and placed over the area of  biopsy.  A 25 mL of 1.5% plain Marcaine was instilled in the peritoneal  cavity.  All counts were correct.  The patient was awakened, taken to   recovery room in stable condition.      Guy Sandifer Henderson Cloud, M.D.  Electronically Signed     JET/MEDQ  D:  06/10/2009  T:  06/10/2009  Job:  [id number]

## 2011-04-01 NOTE — Op Note (Signed)
[facility]  Patient:    [name] Visit Number: [id number] MRN: [id number]          Service Type: DSU Location: [facility] Attending Physician:  Vikki Ports. Dictated by:   Vikki Ports, M.D. Proc. Date: 12/14/01 Admit Date:  12/14/2001                             Operative Report  PREOPERATIVE DIAGNOSIS:  Bilateral breast masses.  POSTOPERATIVE DIAGNOSIS:  Bilateral breast masses.  PROCEDURE:  Excisional bilateral breast biopsies.  SURGEON:  Vikki Ports, M.D.  ANESTHESIA:  Local MAC.  DESCRIPTION OF PROCEDURE:  The patient was taken to the operating room and placed in the supine position.  After adequate MAC anesthesia was induced, bilateral breasts were prepped and draped in the normal sterile fashion. Using a 1% lidocaine local anesthesia, the skin and the subcutaneous tissue in the 12 oclock region of the right breast were anesthetized.  A small curvilinear incision was made over the palpable mass and dissected down on what appeared to be dense fibrous tissue.  This was excised down to normal breast tissue and sent for pathologic evaluation.  Adequate hemostasis was ensured, and the skin was closed with subcuticular 4-0 Monocryl.  I then turned my attention to the left breast.  The periareolar 3 oclock skin and subcutaneous tissue were anesthetized.  A small incision was made, dissected down onto a superficial palpable nodule, which was excised in its entirety and sent for pathologic evaluation.  The skin was closed with subcuticular 4-0 Monocryl.  Steri-Strips and sterile dressings were applied. The patient tolerated the procedure well and went to PACU in good condition. Dictated by:   Vikki Ports, M.D. Attending Physician:  Danna Hefty R. DD:  12/14/01 TD:  12/15/01 Job: [id number]

## 2011-07-08 ENCOUNTER — Other Ambulatory Visit: Payer: Self-pay | Admitting: Obstetrics and Gynecology

## 2011-07-08 DIAGNOSIS — R928 Other abnormal and inconclusive findings on diagnostic imaging of breast: Secondary | ICD-10-CM

## 2011-07-14 ENCOUNTER — Ambulatory Visit
Admission: RE | Admit: 2011-07-14 | Discharge: 2011-07-14 | Disposition: A | Discharge status: Home / Self Care | Payer: 59 | Source: Ambulatory Visit | Attending: Obstetrics and Gynecology | Admitting: Obstetrics and Gynecology

## 2011-07-14 DIAGNOSIS — R928 Other abnormal and inconclusive findings on diagnostic imaging of breast: Secondary | ICD-10-CM

## 2012-03-05 ENCOUNTER — Other Ambulatory Visit: Payer: Self-pay | Admitting: Urology

## 2012-03-27 ENCOUNTER — Encounter (HOSPITAL_COMMUNITY): Payer: Self-pay | Admitting: Pharmacy Technician

## 2012-04-10 ENCOUNTER — Encounter (HOSPITAL_COMMUNITY): Payer: Self-pay | Admitting: *Deleted

## 2012-04-10 NOTE — Progress Notes (Signed)
No Aspirin, Ibuprofen or Toradol 72 hours prior to procedure, bring blur folder,insurance card and picture ID. Eat a light dinner and take laxative by 6 pm 04-11-12 wear comfortable clothing, may have clear liquids until 9 am 04-12-12, patient understood all instructions

## 2012-04-11 NOTE — H&P (Signed)
  History of Present Illness  [name] is a 46 year old with the following urologic history:  1) Nephrolithiasis: She initially presented in February of 2010 and underwent a CT scan demonstrating multiple bilateral renal calculi as well as a left ureteral calculus. 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  Last metabolic evaluation (Oct 2012): Hypocitraturia, low volume, mildly elevated oxalate  Current treatment: HCTZ 25 mg, dietary citrate. potassium citrate intermittently Prior medical treatments: Urocit K (belching), Bicitra (belching)  Prior surgical treatment:  Mar 2010: Ureteroscopic laser lithotripsy Nov 2010: Bilateral ureteroscopic laser lithotripsy  She has a 7 mm interpolar left renal stone and a 9 mm lower pole left renal stone.  Past Medical History Problems  1. Nephrolithiasis 592.0  Surgical History Problems  1. History of  Breast Surgery Lumpectomy 2. History of  Cystoscopy With Insertion Of Ureteral Stent Bilateral 3. History of  Cystoscopy With Insertion Of Ureteral Stent Left 4. History of  Cystoscopy With Pyeloscopy With Lithotripsy 5. History of  Cystoscopy With Ureteroscopy With Lithotripsy 6. History of  Laparoscopic Excision Pelvic Peritoneum Endometriotic Tissue  Current Meds 1. Hydrochlorothiazide 25 MG Oral Tablet; TAKE 1 TABLET qAM; Therapy: 06Apr2011 to  (Renew:12Oct2013)  Requested for: 17Oct2012; Last Rx:17Oct2012 2. Multi-Vitamin TABS; Therapy: (Recorded:26Feb2010) to 3. Potassium Citrate ER 10 MEQ (1080 MG) Oral Tablet Extended Release; TAKE ONE TABLET BY  MOUTH TWICE DAILY; Therapy: 31Mar2011 to (Last Rx:17Oct2012)  Requested for: 17Oct2012 4. Sulindac 200 MG Oral Tablet; Therapy: (Recorded:31Mar2011) to 5. Tylenol CAPS; Therapy: (Recorded:26Feb2010) to  Allergies Medication  1. Aspirin TABS 2. Sulfa Drugs 3. Macrobid CAPS  Family  History Problems  1. Maternal history of  Diverticulosis 2. Maternal history of  Hematuria 3. Maternal history of  Hypertension V17.49 4. Maternal history of  Hypothyroidism  Social History Problems  1. Alcohol Use 2. Marital History - Divorced V61.03 3. Never A Smoker Denied  4. History of  Former Smoker  Vitals  BMI Calculated: 25.61 BSA Calculated: 1.73 Height: 5 ft 4 in Weight: 150 lb    Physical Exam Constitutional: Well nourished and well developed . No acute distress.  Pulmonary: No respiratory distress and normal respiratory rhythm and effort.  Cardiovascular:. No peripheral edema.  Abdomen: No CVA tenderness.    Assessment Assessed  1. Nephrolithiasis 592.0  Plan     1. Left renal calculi: She has elected to proceed with ESWL of her left renal calculi.

## 2012-04-12 ENCOUNTER — Ambulatory Visit (HOSPITAL_COMMUNITY): Payer: 59

## 2012-04-12 ENCOUNTER — Ambulatory Visit (HOSPITAL_COMMUNITY)
Admission: RE | Admit: 2012-04-12 | Discharge: 2012-04-12 | Disposition: A | Discharge status: Home / Self Care | Payer: 59 | Source: Ambulatory Visit | Attending: Urology | Admitting: Urology

## 2012-04-12 ENCOUNTER — Encounter (HOSPITAL_COMMUNITY)
Admission: RE | Disposition: A | Discharge status: Home / Self Care | Payer: Self-pay | Source: Ambulatory Visit | Attending: Urology

## 2012-04-12 ENCOUNTER — Encounter (HOSPITAL_COMMUNITY): Payer: Self-pay | Admitting: *Deleted

## 2012-04-12 DIAGNOSIS — N200 Calculus of kidney: Secondary | ICD-10-CM | POA: Insufficient documentation

## 2012-04-12 DIAGNOSIS — I499 Cardiac arrhythmia, unspecified: Secondary | ICD-10-CM | POA: Insufficient documentation

## 2012-04-12 HISTORY — DX: Chronic kidney disease, unspecified: N18.9

## 2012-04-12 HISTORY — DX: Headache: R51

## 2012-04-12 LAB — PREGNANCY, URINE: Preg Test, Ur: NEGATIVE

## 2012-04-12 SURGERY — LITHOTRIPSY, ESWL
Anesthesia: LOCAL | Laterality: Left

## 2012-04-12 MED ORDER — DEXTROSE-NACL 5-0.45 % IV SOLN
INTRAVENOUS | Status: DC
  Administered 2012-04-12: 14:00:00 via INTRAVENOUS

## 2012-04-12 MED ORDER — CIPROFLOXACIN HCL 500 MG PO TABS
500.0000 mg | ORAL_TABLET | ORAL | Status: AC
  Administered 2012-04-12: 500 mg via ORAL

## 2012-04-12 MED ORDER — DIAZEPAM 5 MG PO TABS
ORAL_TABLET | ORAL | Status: AC
  Administered 2012-04-12: 10 mg via ORAL
  Filled 2012-04-12: qty 2

## 2012-04-12 MED ORDER — DIAZEPAM 5 MG PO TABS
10.0000 mg | ORAL_TABLET | ORAL | Status: AC
  Administered 2012-04-12: 10 mg via ORAL

## 2012-04-12 MED ORDER — CIPROFLOXACIN HCL 500 MG PO TABS
ORAL_TABLET | ORAL | Status: AC
  Administered 2012-04-12: 500 mg via ORAL
  Filled 2012-04-12: qty 1

## 2012-04-12 MED ORDER — DIPHENHYDRAMINE HCL 25 MG PO CAPS
25.0000 mg | ORAL_CAPSULE | ORAL | Status: AC
  Administered 2012-04-12: 25 mg via ORAL

## 2012-04-12 MED ORDER — DIPHENHYDRAMINE HCL 25 MG PO CAPS
ORAL_CAPSULE | ORAL | Status: AC
  Administered 2012-04-12: 25 mg via ORAL
  Filled 2012-04-12: qty 1

## 2012-04-12 NOTE — Discharge Instructions (Signed)
1. You may see some blood in the urine and should strain your urine as instructed. 2. Bring any stone fragments with you to your postoperative appointment. 3. You should call should you develop an inability urinate, fever > 101, persistent nausea and vomiting that prevents you from eating or drinking to stay hydrated, or pain that is not controlled with pain medication.

## 2012-04-12 NOTE — Progress Notes (Signed)
Patient asked about resuming migraine medicine at home (Excedrin migraine with aspirin). Informed patient not to take tonight and to call MD office in am about when to resume if needed. She verbalized understanding and will call in am to clairify. Voided well and home with strainer. Verbalized understanding of d/c instructions.

## 2012-04-12 NOTE — Op Note (Signed)
See operative note in paper chart. 

## 2012-04-13 ENCOUNTER — Encounter (HOSPITAL_COMMUNITY): Payer: Self-pay

## 2012-07-25 ENCOUNTER — Other Ambulatory Visit: Payer: Self-pay | Admitting: Obstetrics and Gynecology

## 2012-07-25 DIAGNOSIS — R928 Other abnormal and inconclusive findings on diagnostic imaging of breast: Secondary | ICD-10-CM

## 2012-08-01 ENCOUNTER — Other Ambulatory Visit: Payer: Self-pay | Admitting: Obstetrics and Gynecology

## 2012-08-01 ENCOUNTER — Ambulatory Visit
Admission: RE | Admit: 2012-08-01 | Discharge: 2012-08-01 | Disposition: A | Discharge status: Home / Self Care | Payer: 59 | Source: Ambulatory Visit | Attending: Obstetrics and Gynecology | Admitting: Obstetrics and Gynecology

## 2012-08-01 DIAGNOSIS — R928 Other abnormal and inconclusive findings on diagnostic imaging of breast: Secondary | ICD-10-CM

## 2012-08-01 DIAGNOSIS — N6310 Unspecified lump in the right breast, unspecified quadrant: Secondary | ICD-10-CM

## 2013-01-28 ENCOUNTER — Other Ambulatory Visit: Payer: Self-pay | Admitting: Obstetrics and Gynecology

## 2013-04-11 ENCOUNTER — Other Ambulatory Visit: Payer: Self-pay | Admitting: Obstetrics and Gynecology

## 2014-07-31 ENCOUNTER — Other Ambulatory Visit: Payer: Self-pay | Admitting: Obstetrics and Gynecology

## 2014-07-31 ENCOUNTER — Encounter: Payer: Self-pay | Admitting: Gastroenterology

## 2014-08-01 LAB — CYTOLOGY - PAP

## 2015-05-06 ENCOUNTER — Other Ambulatory Visit: Payer: Self-pay | Admitting: Urology

## 2015-06-01 NOTE — Patient Instructions (Signed)
GLORIAN MCDONELL  06/01/2015   Your procedure is scheduled on:    06/04/2015    Report to [facility] Main  Entrance take [place]  elevators to 3rd floor to  [place] at       [place] AM.  Call this number if you have problems the morning of surgery [phone]   Remember: ONLY 1 PERSON MAY GO WITH YOU TO SHORT STAY TO GET  READY MORNING OF [place].  Do not eat food or drink liquids :After Midnight.     Take these medicines the morning of surgery with A SIP OF WATER: Hydrocodone if needed                                You may not have any metal on your body including hair pins and              piercings  Do not wear jewelry, make-up, lotions, powders or perfumes, deodorant             Do not wear nail polish.  Do not shave  48 hours prior to surgery.               Do not bring valuables to the hospital. [place].  Contacts, dentures or bridgework may not be worn into surgery.      Patients discharged the day of surgery will not be allowed to drive home.  Name and phone number of your driver:  Special Instructions: coughing and deep breathing exercises, leg exercises               Please read over the following fact sheets you were given: _____________________________________________________________________             [facility] - Preparing for Surgery Before surgery, you can play an important role.  Because skin is not sterile, your skin needs to be as free of germs as possible.  You can reduce the number of germs on your skin by washing with CHG (chlorahexidine gluconate) soap before surgery.  CHG is an antiseptic cleaner which kills germs and bonds with the skin to continue killing germs even after washing. Please DO NOT use if you have an allergy to CHG or antibacterial soaps.  If your skin becomes reddened/irritated stop using the CHG and inform your nurse when you arrive at Short Stay. Do not  shave (including legs and underarms) for at least 48 hours prior to the first CHG shower.  You may shave your face/neck. Please follow these instructions carefully:  1.  Shower with CHG Soap the night before surgery and the  morning of Surgery.  2.  If you choose to wash your hair, wash your hair first as usual with your  normal  shampoo.  3.  After you shampoo, rinse your hair and body thoroughly to remove the  shampoo.                           4.  Use CHG as you would any other liquid soap.  You can apply chg directly  to the skin and wash  Gently with a scrungie or clean washcloth.  5.  Apply the CHG Soap to your body ONLY FROM THE NECK DOWN.   Do not use on face/ open                           Wound or open sores. Avoid contact with eyes, ears mouth and genitals (private parts).                       Wash face,  Genitals (private parts) with your normal soap.             6.  Wash thoroughly, paying special attention to the area where your surgery  will be performed.  7.  Thoroughly rinse your body with warm water from the neck down.  8.  DO NOT shower/wash with your normal soap after using and rinsing off  the CHG Soap.                9.  Pat yourself dry with a clean towel.            10.  Wear clean pajamas.            11.  Place clean sheets on your bed the night of your first shower and do not  sleep with pets. Day of Surgery : Do not apply any lotions/deodorants the morning of surgery.  Please wear clean clothes to the hospital/surgery center.  FAILURE TO FOLLOW THESE INSTRUCTIONS MAY RESULT IN THE CANCELLATION OF YOUR SURGERY PATIENT SIGNATURE_________________________________  NURSE SIGNATURE__________________________________  ________________________________________________________________________

## 2015-06-02 ENCOUNTER — Encounter (HOSPITAL_COMMUNITY): Payer: Self-pay

## 2015-06-02 ENCOUNTER — Encounter (HOSPITAL_COMMUNITY)
Admission: RE | Admit: 2015-06-02 | Discharge: 2015-06-02 | Disposition: A | Discharge status: Home / Self Care | Payer: 59 | Source: Ambulatory Visit | Attending: Urology | Admitting: Urology

## 2015-06-02 DIAGNOSIS — Z87442 Personal history of urinary calculi: Secondary | ICD-10-CM | POA: Diagnosis not present

## 2015-06-02 DIAGNOSIS — Z882 Allergy status to sulfonamides status: Secondary | ICD-10-CM | POA: Diagnosis not present

## 2015-06-02 DIAGNOSIS — Z886 Allergy status to analgesic agent status: Secondary | ICD-10-CM | POA: Diagnosis not present

## 2015-06-02 DIAGNOSIS — N202 Calculus of kidney with calculus of ureter: Secondary | ICD-10-CM | POA: Diagnosis present

## 2015-06-02 DIAGNOSIS — Z79899 Other long term (current) drug therapy: Secondary | ICD-10-CM | POA: Diagnosis not present

## 2015-06-02 DIAGNOSIS — Z3200 Encounter for pregnancy test, result unknown: Secondary | ICD-10-CM | POA: Diagnosis not present

## 2015-06-02 HISTORY — DX: Malignant (primary) neoplasm, unspecified: C80.1

## 2015-06-02 HISTORY — DX: Major depressive disorder, single episode, unspecified: F32.9

## 2015-06-02 HISTORY — DX: Gastro-esophageal reflux disease without esophagitis: K21.9

## 2015-06-02 HISTORY — DX: Other complications of anesthesia, initial encounter: T88.59XA

## 2015-06-02 HISTORY — DX: Depression, unspecified: F32.A

## 2015-06-02 HISTORY — DX: Adverse effect of unspecified anesthetic, initial encounter: T41.45XA

## 2015-06-02 HISTORY — DX: Family history of other specified conditions: Z84.89

## 2015-06-02 HISTORY — DX: Cardiac murmur, unspecified: R01.1

## 2015-06-02 LAB — CBC
HEMATOCRIT: 41.2 % (ref 36.0–46.0)
Hemoglobin: 13.6 g/dL (ref 12.0–15.0)
MCH: 29.6 pg (ref 26.0–34.0)
MCHC: 33 g/dL (ref 30.0–36.0)
MCV: 89.8 fL (ref 78.0–100.0)
Platelets: 291 10*3/uL (ref 150–400)
RBC: 4.59 MIL/uL (ref 3.87–5.11)
RDW: 13.5 % (ref 11.5–15.5)
WBC: 9.6 10*3/uL (ref 4.0–10.5)

## 2015-06-02 LAB — BASIC METABOLIC PANEL
Anion gap: 7 (ref 5–15)
BUN: 8 mg/dL (ref 6–20)
CO2: 27 mmol/L (ref 22–32)
Calcium: 9.2 mg/dL (ref 8.9–10.3)
Chloride: 102 mmol/L (ref 101–111)
Creatinine, Ser: 0.74 mg/dL (ref 0.44–1.00)
GFR calc Af Amer: >60 mL/min (ref >60)
GFR calc non Af Amer: >60 mL/min (ref >60)
GLUCOSE: 122 mg/dL — AB (ref 65–99)
Potassium: 3.3 mmol/L — ABNORMAL LOW (ref 3.5–5.1)
SODIUM: 136 mmol/L (ref 135–145)

## 2015-06-03 NOTE — H&P (Signed)
History of Present Illness [name] is a 49 year old with the following urologic history:    1) Nephrolithiasis: She initially presented in February of 2010 and underwent a CT scan demonstrating multiple bilateral renal calculi as well as a left ureteral calculus. 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    Last metabolic evaluation (Mar 1914): Hypocitraturia, low volume,   Current treatment: HCTZ 25 mg, dietary citrate. potassium citrate intermittently  Prior medical treatments: Urocit K (belching), Bicitra (belching)    Prior surgical treatment:   Mar 2010: Ureteroscopic laser lithotripsy  Nov 2010: Bilateral ureteroscopic laser lithotripsy  May 2013: ESWL of left renal calculi  She has increasing stone burden bilaterally.   Past Medical History Problems  1. Nephrolithiasis (N20.0)  Surgical History Problems  1. History of Breast Surgery Lumpectomy 2. History of Cystoscopy With Insertion Of Ureteral Stent Bilateral 3. History of Cystoscopy With Insertion Of Ureteral Stent Left 4. History of Cystoscopy With Pyeloscopy With Lithotripsy 5. History of Cystoscopy With Ureteroscopy With Lithotripsy 6. History of Laparoscopic Excision Pelvic Peritoneum Endometriotic Tissue 7. History of Lithotripsy  Current Meds 1. Betamethasone Valerate 0.1 % External Ointment;  Therapy: 02Dec2014 to Recorded 2. BusPIRone HCl - 10 MG Oral Tablet;  Therapy: 02Dec2014 to Recorded 3. Hydrochlorothiazide 25 MG Oral Tablet; TAKE 1 TABLET qAM;  Therapy: 06Apr2011 to (Renew:03Dec2016)  Requested for: [id number]; Last  Rx:09Dec2015 Ordered 4. Multi-Vitamin TABS;  Therapy: (Recorded:26Feb2010) to Recorded 5. Ondansetron 4 MG Oral Tablet Dispersible; TAKE 1 TABLET Every 6 hours PRN nausea;  Therapy: [id number] to (Last GE:95MWU1324)  Requested for: 26Jun2015 Ordered 6. Potassium Citrate ER 10 MEQ  (1080 MG) Oral Tablet Extended Release; Take 1 tablet  twice daily;  Therapy: 40NUU7253 to (Evaluate:07Apr2016)  Requested for: [id number]; Last  Rx:09Dec2015 Ordered 7. TraMADol HCl - 50 MG Oral Tablet; TAKE 1 TABLET Every 6 hours PRN;  Therapy: [id number] to (Last Rx:10Dec2013)  Requested for: [id number] Ordered 8. Zoloft 50 MG Oral Tablet;  Therapy: (Recorded:09Dec2015) to Recorded  Allergies Medication  1. Aspirin TABS 2. Sulfa Drugs 3. Flomax CAPS 4. Macrobid CAPS  Family History Problems  1. Family history of Diverticulosis : Mother 2. Family history of Hematuria : Mother 3. Family history of Hypertension : Mother 4. Family history of Hypothyroidism : Mother  Social History Problems  1. Alcohol Use 2. Denied: History of Former Smoker 3. Marital History - Divorced 4. Never A Smoker  Vitals  Weight: 164 lb  BMI Calculated: 28.15 BSA Calculated: 1.8   Physical Exam Constitutional: Well nourished and well developed . No acute distress.  Pulmonary: No respiratory distress and normal respiratory rhythm and effort.  Cardiovascular: Heart rate and rhythm are normal . No peripheral edema.  Abdomen: The abdomen is soft and nontender. No masses are palpated. No CVA tenderness. No hernias are palpable. No hepatosplenomegaly noted.      Assessment Assessed  1. Nephrolithiasis (N20.0)   Discussion/Summary 1. Bilateral renal calculi: She will undergo left ureteroscopic laser lithotripsy for treatment. We also discussed treating her right-sided stones at the same setting with the goal of trying to render her virtually stone free. I did express my concern about the very unlikely a potentially serious risk of operating on both renal units at the same setting, she understands we could consider treating her left side and then proceeding to the right side assuming that her left-sided procedure does not take an extended period of  time in a similar there are no concerns for injury to  the left side. I discussed the potential benefits and risks of the procedure, side effects of the proposed treatment, the likelihood of the patient achieving the goals of the procedure, and any potential problems that might occur during the procedure or recuperation.

## 2015-06-04 ENCOUNTER — Ambulatory Visit (HOSPITAL_COMMUNITY): Payer: 59 | Admitting: Anesthesiology

## 2015-06-04 ENCOUNTER — Encounter (HOSPITAL_COMMUNITY)
Admission: RE | Disposition: A | Discharge status: Home / Self Care | Payer: Self-pay | Source: Ambulatory Visit | Attending: Urology

## 2015-06-04 ENCOUNTER — Encounter (HOSPITAL_COMMUNITY): Payer: Self-pay | Admitting: *Deleted

## 2015-06-04 ENCOUNTER — Ambulatory Visit (HOSPITAL_COMMUNITY)
Admission: RE | Admit: 2015-06-04 | Discharge: 2015-06-04 | Disposition: A | Discharge status: Home / Self Care | Payer: 59 | Source: Ambulatory Visit | Attending: Urology | Admitting: Urology

## 2015-06-04 DIAGNOSIS — Z882 Allergy status to sulfonamides status: Secondary | ICD-10-CM | POA: Insufficient documentation

## 2015-06-04 DIAGNOSIS — N202 Calculus of kidney with calculus of ureter: Secondary | ICD-10-CM | POA: Insufficient documentation

## 2015-06-04 DIAGNOSIS — Z79899 Other long term (current) drug therapy: Secondary | ICD-10-CM | POA: Insufficient documentation

## 2015-06-04 DIAGNOSIS — Z87442 Personal history of urinary calculi: Secondary | ICD-10-CM | POA: Insufficient documentation

## 2015-06-04 DIAGNOSIS — Z3200 Encounter for pregnancy test, result unknown: Secondary | ICD-10-CM | POA: Insufficient documentation

## 2015-06-04 DIAGNOSIS — Z886 Allergy status to analgesic agent status: Secondary | ICD-10-CM | POA: Insufficient documentation

## 2015-06-04 HISTORY — PX: CYSTOSCOPY WITH HOLMIUM LASER LITHOTRIPSY: SHX6639

## 2015-06-04 HISTORY — PX: CYSTOSCOPY WITH RETROGRADE PYELOGRAM, URETEROSCOPY AND STENT PLACEMENT: SHX5789

## 2015-06-04 LAB — PREGNANCY, URINE: Preg Test, Ur: NEGATIVE

## 2015-06-04 SURGERY — CYSTOURETEROSCOPY, WITH RETROGRADE PYELOGRAM AND STENT INSERTION
Anesthesia: General | Laterality: Bilateral

## 2015-06-04 MED ORDER — APREPITANT 80 & 125 MG PO MISC: 125.0000 mg | Freq: Once | ORAL | Status: DC

## 2015-06-04 MED ORDER — PROMETHAZINE HCL 25 MG/ML IJ SOLN
INTRAMUSCULAR | Status: AC
  Filled 2015-06-04: qty 1

## 2015-06-04 MED ORDER — NEOSTIGMINE METHYLSULFATE 10 MG/10ML IV SOLN
INTRAVENOUS | Status: DC | PRN
  Administered 2015-06-04: 3 mg via INTRAVENOUS

## 2015-06-04 MED ORDER — LIDOCAINE HCL (CARDIAC) 20 MG/ML IV SOLN
INTRAVENOUS | Status: DC | PRN
  Administered 2015-06-04: 100 mg via INTRAVENOUS

## 2015-06-04 MED ORDER — PROPOFOL 10 MG/ML IV BOLUS
INTRAVENOUS | Status: AC
  Filled 2015-06-04: qty 20

## 2015-06-04 MED ORDER — CIPROFLOXACIN IN D5W 400 MG/200ML IV SOLN
400.0000 mg | INTRAVENOUS | Status: AC
  Administered 2015-06-04: 400 mg via INTRAVENOUS

## 2015-06-04 MED ORDER — ONDANSETRON HCL 4 MG/2ML IJ SOLN
INTRAMUSCULAR | Status: AC
  Filled 2015-06-04: qty 2

## 2015-06-04 MED ORDER — LIDOCAINE HCL (CARDIAC) 20 MG/ML IV SOLN
INTRAVENOUS | Status: AC
  Filled 2015-06-04: qty 5

## 2015-06-04 MED ORDER — DEXAMETHASONE SODIUM PHOSPHATE 10 MG/ML IJ SOLN
INTRAMUSCULAR | Status: AC
  Filled 2015-06-04: qty 2

## 2015-06-04 MED ORDER — SODIUM CHLORIDE 0.9 % IJ SOLN
INTRAMUSCULAR | Status: AC
  Filled 2015-06-04: qty 10

## 2015-06-04 MED ORDER — APREPITANT 80 & 125 MG PO TRIPAK DAY 2 & 3: 80.0000 mg | Freq: Every day | ORAL | Status: DC

## 2015-06-04 MED ORDER — EPHEDRINE SULFATE 50 MG/ML IJ SOLN
INTRAMUSCULAR | Status: DC | PRN
  Administered 2015-06-04 (×3): 5 mg via INTRAVENOUS

## 2015-06-04 MED ORDER — EPHEDRINE SULFATE 50 MG/ML IJ SOLN
INTRAMUSCULAR | Status: AC
  Filled 2015-06-04: qty 1

## 2015-06-04 MED ORDER — CISATRACURIUM BESYLATE (PF) 10 MG/5ML IV SOLN
INTRAVENOUS | Status: DC | PRN
  Administered 2015-06-04: 2 mg via INTRAVENOUS
  Administered 2015-06-04: 6 mg via INTRAVENOUS
  Administered 2015-06-04: 2 mg via INTRAVENOUS

## 2015-06-04 MED ORDER — CISATRACURIUM BESYLATE 20 MG/10ML IV SOLN
INTRAVENOUS | Status: AC
  Filled 2015-06-04: qty 10

## 2015-06-04 MED ORDER — MIDAZOLAM HCL 2 MG/2ML IJ SOLN
INTRAMUSCULAR | Status: AC
  Filled 2015-06-04: qty 2

## 2015-06-04 MED ORDER — HYDROCODONE-ACETAMINOPHEN 5-325 MG PO TABS: 1.0000 | ORAL_TABLET | Freq: Four times a day (QID) | ORAL | Status: AC | PRN

## 2015-06-04 MED ORDER — FENTANYL CITRATE (PF) 100 MCG/2ML IJ SOLN: 25.0000 ug | INTRAMUSCULAR | Status: DC | PRN

## 2015-06-04 MED ORDER — DEXAMETHASONE SODIUM PHOSPHATE 10 MG/ML IJ SOLN
INTRAMUSCULAR | Status: DC | PRN
  Administered 2015-06-04: 10 mg via INTRAVENOUS

## 2015-06-04 MED ORDER — SCOPOLAMINE 1 MG/3DAYS TD PT72
1.0000 | MEDICATED_PATCH | Freq: Once | TRANSDERMAL | Status: DC
  Administered 2015-06-04: 1.5 mg via TRANSDERMAL

## 2015-06-04 MED ORDER — PROMETHAZINE HCL 25 MG/ML IJ SOLN
6.2500 mg | INTRAMUSCULAR | Status: DC | PRN
  Administered 2015-06-04: 12.5 mg via INTRAVENOUS

## 2015-06-04 MED ORDER — SCOPOLAMINE 1 MG/3DAYS TD PT72
MEDICATED_PATCH | TRANSDERMAL | Status: AC
  Filled 2015-06-04: qty 1

## 2015-06-04 MED ORDER — GLYCOPYRROLATE 0.2 MG/ML IJ SOLN
INTRAMUSCULAR | Status: AC
  Filled 2015-06-04: qty 2

## 2015-06-04 MED ORDER — PROPOFOL 10 MG/ML IV BOLUS
INTRAVENOUS | Status: DC | PRN
  Administered 2015-06-04: 150 mg via INTRAVENOUS

## 2015-06-04 MED ORDER — CIPROFLOXACIN IN D5W 400 MG/200ML IV SOLN
INTRAVENOUS | Status: AC
Start: 2015-06-04 — End: 2015-06-04
  Filled 2015-06-04: qty 200

## 2015-06-04 MED ORDER — FENTANYL CITRATE (PF) 250 MCG/5ML IJ SOLN
INTRAMUSCULAR | Status: AC
  Filled 2015-06-04: qty 5

## 2015-06-04 MED ORDER — LACTATED RINGERS IV SOLN: INTRAVENOUS | Status: DC

## 2015-06-04 MED ORDER — FENTANYL CITRATE (PF) 100 MCG/2ML IJ SOLN
INTRAMUSCULAR | Status: DC | PRN
  Administered 2015-06-04: 50 ug via INTRAVENOUS
  Administered 2015-06-04: 150 ug via INTRAVENOUS
  Administered 2015-06-04: 50 ug via INTRAVENOUS

## 2015-06-04 MED ORDER — MIDAZOLAM HCL 5 MG/5ML IJ SOLN
INTRAMUSCULAR | Status: DC | PRN
  Administered 2015-06-04: 2 mg via INTRAVENOUS

## 2015-06-04 MED ORDER — ONDANSETRON HCL 4 MG/2ML IJ SOLN
INTRAMUSCULAR | Status: DC | PRN
  Administered 2015-06-04: 4 mg via INTRAVENOUS

## 2015-06-04 MED ORDER — MEPERIDINE HCL 50 MG/ML IJ SOLN: 6.2500 mg | INTRAMUSCULAR | Status: DC | PRN

## 2015-06-04 MED ORDER — LACTATED RINGERS IV SOLN
INTRAVENOUS | Status: DC
  Administered 2015-06-04: 1000 mL via INTRAVENOUS
  Administered 2015-06-04: 13:00:00 via INTRAVENOUS

## 2015-06-04 MED ORDER — GLYCOPYRROLATE 0.2 MG/ML IJ SOLN
INTRAMUSCULAR | Status: DC | PRN
  Administered 2015-06-04: 0.4 mg via INTRAVENOUS

## 2015-06-04 MED ORDER — SUCCINYLCHOLINE CHLORIDE 20 MG/ML IJ SOLN
INTRAMUSCULAR | Status: DC | PRN
  Administered 2015-06-04: 80 mg via INTRAVENOUS

## 2015-06-04 MED ORDER — PHENAZOPYRIDINE HCL 100 MG PO TABS: 100.0000 mg | ORAL_TABLET | Freq: Three times a day (TID) | ORAL | Status: AC | PRN

## 2015-06-04 MED ORDER — PROPOFOL INFUSION 10 MG/ML OPTIME
INTRAVENOUS | Status: DC | PRN
  Administered 2015-06-04: 180 ug/kg/min via INTRAVENOUS

## 2015-06-04 MED ORDER — SODIUM CHLORIDE 0.9 % IR SOLN
Status: DC | PRN
  Administered 2015-06-04: 1000 mL

## 2015-06-04 SURGICAL SUPPLY — 16 items
BAG URO CATCHER STRL LF (DRAPE) ×3 IMPLANT
BASKET ZERO TIP NITINOL 2.4FR (BASKET) ×6 IMPLANT
CATH INTERMIT  6FR 70CM (CATHETERS) ×3 IMPLANT
CLOTH BEACON ORANGE TIMEOUT ST (SAFETY) ×3 IMPLANT
FIBER LASER FLEXIVA 200 (UROLOGICAL SUPPLIES) ×3 IMPLANT
FIBER LASER TRAC TIP (UROLOGICAL SUPPLIES) ×3 IMPLANT
GLOVE BIOGEL M STRL SZ7.5 (GLOVE) ×3 IMPLANT
GOWN STRL REUS W/TWL LRG LVL3 (GOWN DISPOSABLE) ×6 IMPLANT
GUIDEWIRE ANG ZIPWIRE 038X150 (WIRE) IMPLANT
GUIDEWIRE STR DUAL SENSOR (WIRE) ×3 IMPLANT
MANIFOLD NEPTUNE II (INSTRUMENTS) ×3 IMPLANT
PACK CYSTO (CUSTOM PROCEDURE TRAY) ×3 IMPLANT
SHEATH ACCESS URETERAL 38CM (SHEATH) IMPLANT
STENT CONTOUR 6FRX24X.038 (STENTS) ×6 IMPLANT
TUBING CONNECTING 10 (TUBING) ×2 IMPLANT
TUBING CONNECTING 10' (TUBING) ×1

## 2015-06-04 NOTE — Anesthesia Procedure Notes (Signed)
Procedure Name: Intubation Date/Time: 06/04/2015 12:01 PM Performed by: Danley Danker L Patient Re-evaluated:Patient Re-evaluated prior to inductionOxygen Delivery Method: Circle system utilized Preoxygenation: Pre-oxygenation with 100% oxygen Intubation Type: IV induction Ventilation: Mask ventilation without difficulty and Oral airway inserted - appropriate to patient size Laryngoscope Size: Sabra Heck and 2 Grade View: Grade I Tube type: Oral Tube size: 7.5 mm Number of attempts: 1 Airway Equipment and Method: Stylet Placement Confirmation: ETT inserted through vocal cords under direct vision,  positive ETCO2 and breath sounds checked- equal and bilateral Secured at: 20 cm Tube secured with: Tape Dental Injury: Teeth and Oropharynx as per pre-operative assessment

## 2015-06-04 NOTE — Op Note (Signed)
Preoperative diagnosis: Bilateral renal calculi   Postoperative diagnosis: Bilateral renal calculi calculus  Procedure:  1. Cystoscopy 2. Bilateral ureteroscopy and stone removal 3. Bilateral ureteroscopic laser lithotripsy 4. Bilateral ureteral stent placement (6 x 24 on each side) 5. Bilateral retrograde pyelography with interpretation  Surgeon: Pryor Curia. M.D.  Anesthesia: General  Complications: None  Intraoperative findings: Bilateral retrograde pyelography demonstrated multiple filling defects within the renal pelvis bilaterally consistent with the patient's known renal calculi without other abnormalities noted.  There were no filling defects or other abnormalities of the ureters bilaterally.  EBL: Minimal  Specimens: 1. Left renal calculi 2. Right renal calculi  Disposition of specimens: [facility] for stone analysis  Indication: NELLI SWALLEY  is a 49 y.o. patient with urolithiasis. She had enlarging bilateral renal stone burden (left greater than right).After reviewing the management options for treatment, they elected to proceed with the above surgical procedure(s). We have discussed the potential benefits and risks of the procedure, side effects of the proposed treatment, the likelihood of the patient achieving the goals of the procedure, and any potential problems that might occur during the procedure or recuperation. Informed consent has been obtained.  We agreed to address the left side and to proceed with treatment on the right only if her left sided procedure was uncomplicated.  Description of procedure:  The patient was taken to the operating room and general anesthesia was induced.  The patient was placed in the dorsal lithotomy position, prepped and draped in the usual sterile fashion, and preoperative antibiotics were administered. A preoperative time-out was performed.   Cystourethroscopy was performed.  The patient's urethra was  examined and was normal. The bladder was then systematically examined in its entirety. There was no evidence for any bladder tumors, stones, or other mucosal pathology.    Attention then turned to the left ureteral orifice and a ureteral catheter was used to intubate the ureteral orifice.  Omnipaque contrast was injected through the ureteral catheter and a retrograde pyelogram was performed with findings as dictated above.  A 0.38 sensor guidewire was then advanced up the left ureter into the renal pelvis under fluoroscopic guidance.  A 12/14 Fr ureteral access sheath was then advanced over the guide wire. The digital flexible ureteroscope was then advanced through the access sheath into the ureter next to the guidewire and the calculi were identified.  The largest stone was in the lower pole with multiple stones throughout the calyceal system.  Any sizable stones were then fragmented with the 200 micron holmium laser fiber on a setting of 0.6 J and frequency of  6 Hz.   All sizable stones were then removed with a zero tip nitinol basket. A large burden of stones were removed with more than 2 cm of stone removed altogether.  Reinspection of the ureter/renal pelvis revealed no remaining visible stones or fragments of significant size.   The safety wire was then replaced and the access sheath removed.  The guidewire was backloaded through the cystoscope and a ureteral stent was advance over the wire using Seldinger technique.  The stent was positioned appropriately under fluoroscopic and cystoscopic guidance.  The wire was then removed with an adequate stent curl noted in the renal pelvis as well as in the bladder.  Considering that the left sided procedure went very well without complications and considering that the time under anesthesia was not excessive, we decided to proceed with treatment of her right renal calculi as well.  Attention then turned to the right ureteral orifice and a ureteral  catheter was used to intubate the ureteral orifice.  Omnipaque contrast was injected through the ureteral catheter and a retrograde pyelogram was performed with findings as dictated above.  A 0.38 sensor guidewire was then advanced up the right ureter into the renal pelvis under fluoroscopic guidance.  A 12/14 Fr ureteral access sheath was then advanced over the guide wire. The digital flexible ureteroscope was then advanced through the access sheath into the ureter next to the guidewire and the calculi were identified and was located throughout the calyceal system.  There were about 6-7 stones all smaller compared to the left side.  Most stones were basketed and removed.  There were some small fragments still attached to papillae and these were fragmented with the 200 micron holmium laser fiber on a setting of 0.6 J and frequency of 6 Hz.   All sizable stones were then removed with a zero tip nitinol basket.  Reinspection of the ureter/renal pelvis revealed no remaining visible stones or fragments of significant size.   The safety wire was then replaced and the access sheath removed.  The guidewire was backloaded through the cystoscope and a ureteral stent was advance over the wire using Seldinger technique.  The stent was positioned appropriately under fluoroscopic and cystoscopic guidance.  The wire was then removed with an adequate stent curl noted in the renal pelvis as well as in the bladder.  The bladder was then emptied and the procedure ended.  The patient appeared to tolerate the procedure well and without complications.  The patient was able to be awakened and transferred to the recovery unit in satisfactory condition.   Pryor Curia MD

## 2015-06-04 NOTE — Discharge Instructions (Addendum)
1. You may see some blood in the urine and may have some burning with urination for 48-72 hours. You also may notice that you have to urinate more frequently or urgently after your procedure which is normal.  2. You should call should you develop an inability urinate, fever > 101, persistent nausea and vomiting that prevents you from eating or drinking to stay hydrated.  3. If you have a stent, you will likely urinate more frequently and urgently until the stent is removed and you may experience some discomfort/pain in the lower abdomen and flank especially when urinating. You may take pain medication prescribed to you if needed for pain. You may also intermittently have blood in the urine until the stent is removed.      General Anesthesia, Care After Refer to this sheet in the next few weeks. These instructions provide you with information on caring for yourself after your procedure. Your health care provider may also give you more specific instructions. Your treatment has been planned according to current medical practices, but problems sometimes occur. Call your health care provider if you have any problems or questions after your procedure. WHAT TO EXPECT AFTER THE PROCEDURE After the procedure, it is typical to experience:  Sleepiness.  Nausea and vomiting. HOME CARE INSTRUCTIONS  For the first 24 hours after general anesthesia:  Have a responsible person with you.  Do not drive a car. If you are alone, do not take public transportation.  Do not drink alcohol.  Do not take medicine that has not been prescribed by your health care provider.  Do not sign important papers or make important decisions.  You may resume a normal diet and activities as directed by your health care provider.  Change bandages (dressings) as directed.  If you have questions or problems that seem related to general anesthesia, call the hospital and ask for the anesthetist or anesthesiologist on  call. SEEK MEDICAL CARE IF:  You have nausea and vomiting that continue the day after anesthesia.  You develop a rash. SEEK IMMEDIATE MEDICAL CARE IF:   You have difficulty breathing.  You have chest pain.  You have any allergic problems. Document Released: 02/06/2001 Document Revised: 11/05/2013 Document Reviewed: 05/16/2013 [facility] Patient Information 2015 [place], [place]. This information is not intended to replace advice given to you by your health care provider. Make sure you discuss any questions you have with your health care provider.

## 2015-06-04 NOTE — Anesthesia Preprocedure Evaluation (Signed)
Anesthesia Evaluation  Patient identified by MRN, date of birth, ID band Patient awake    Reviewed: Allergy & Precautions, NPO status , Patient's Chart, lab work & pertinent test results  History of Anesthesia Complications (+) PONV  Airway Mallampati: II  TM Distance: >3 FB Neck ROM: Full    Dental no notable dental hx.    Pulmonary neg pulmonary ROS,  breath sounds clear to auscultation  Pulmonary exam normal       Cardiovascular negative cardio ROS Normal cardiovascular examRhythm:Regular Rate:Normal     Neuro/Psych negative neurological ROS  negative psych ROS   GI/Hepatic negative GI ROS, Neg liver ROS,   Endo/Other  negative endocrine ROS  Renal/GU negative Renal ROS  negative genitourinary   Musculoskeletal negative musculoskeletal ROS (+)   Abdominal   Peds negative pediatric ROS (+)  Hematology negative hematology ROS (+)   Anesthesia Other Findings   Reproductive/Obstetrics negative OB ROS                             Anesthesia Physical Anesthesia Plan  ASA: II  Anesthesia Plan: General   Post-op Pain Management:    Induction: Intravenous  Airway Management Planned: Oral ETT and LMA  Additional Equipment:   Intra-op Plan:   Post-operative Plan: Extubation in OR  Informed Consent: I have reviewed the patients History and Physical, chart, labs and discussed the procedure including the risks, benefits and alternatives for the proposed anesthesia with the patient or authorized representative who has indicated his/her understanding and acceptance.   Dental advisory given  Plan Discussed with: CRNA  Anesthesia Plan Comments:         Anesthesia Quick Evaluation

## 2015-06-04 NOTE — Transfer of Care (Signed)
Immediate Anesthesia Transfer of Care Note  Patient: [name]  Procedure(s) Performed: Procedure(s): CYSTOSCOPY WITH RETROGRADE PYELOGRAM, URETEROSCOPY WITH STONE EXTRACTION AND STENT PLACEMENT (Bilateral) CYSTOSCOPY WITH HOLMIUM LASER LITHOTRIPSY (Bilateral)  Patient Location: PACU  Anesthesia Type:General  Level of Consciousness: awake, alert  and oriented  Airway & Oxygen Therapy: Patient Spontanous Breathing and Patient connected to face mask oxygen  Post-op Assessment: Report given to RN and Post -op Vital signs reviewed and stable  Post vital signs: Reviewed and stable  Last Vitals:  Filed Vitals:   06/04/15 0859  BP: 120/78  Pulse: 78  Temp: 37.2 C  Resp: 16    Complications: No apparent anesthesia complications

## 2015-06-05 ENCOUNTER — Encounter (HOSPITAL_COMMUNITY): Payer: Self-pay | Admitting: Urology

## 2015-06-05 NOTE — Anesthesia Postprocedure Evaluation (Signed)
  Anesthesia Post-op Note  Patient: [name]  Procedure(s) Performed: Procedure(s) (LRB): CYSTOSCOPY WITH RETROGRADE PYELOGRAM, URETEROSCOPY WITH STONE EXTRACTION AND STENT PLACEMENT (Bilateral) CYSTOSCOPY WITH HOLMIUM LASER LITHOTRIPSY (Bilateral)  Patient Location: PACU  Anesthesia Type: General  Level of Consciousness: awake and alert   Airway and Oxygen Therapy: Patient Spontanous Breathing  Post-op Pain: mild  Post-op Assessment: Post-op Vital signs reviewed, Patient's Cardiovascular Status Stable, Respiratory Function Stable, Patent Airway and No signs of Nausea or vomiting  Last Vitals:  Filed Vitals:   06/04/15 1614  BP: 100/51  Pulse: 70  Temp: 37.1 C  Resp: 16    Post-op Vital Signs: stable   Complications: No apparent anesthesia complications

## 2015-06-10 ENCOUNTER — Encounter (HOSPITAL_COMMUNITY): Payer: Self-pay | Admitting: *Deleted

## 2015-06-10 ENCOUNTER — Emergency Department (HOSPITAL_COMMUNITY)
Admission: EM | Admit: 2015-06-10 | Discharge: 2015-06-10 | Disposition: A | Discharge status: Home / Self Care | Payer: 59 | Attending: Emergency Medicine | Admitting: Emergency Medicine

## 2015-06-10 DIAGNOSIS — S3991XA Unspecified injury of abdomen, initial encounter: Secondary | ICD-10-CM | POA: Insufficient documentation

## 2015-06-10 DIAGNOSIS — Y92410 Unspecified street and highway as the place of occurrence of the external cause: Secondary | ICD-10-CM | POA: Insufficient documentation

## 2015-06-10 DIAGNOSIS — Y998 Other external cause status: Secondary | ICD-10-CM | POA: Insufficient documentation

## 2015-06-10 DIAGNOSIS — R011 Cardiac murmur, unspecified: Secondary | ICD-10-CM | POA: Insufficient documentation

## 2015-06-10 DIAGNOSIS — N189 Chronic kidney disease, unspecified: Secondary | ICD-10-CM | POA: Insufficient documentation

## 2015-06-10 DIAGNOSIS — F329 Major depressive disorder, single episode, unspecified: Secondary | ICD-10-CM | POA: Insufficient documentation

## 2015-06-10 DIAGNOSIS — Z8719 Personal history of other diseases of the digestive system: Secondary | ICD-10-CM | POA: Diagnosis not present

## 2015-06-10 DIAGNOSIS — Z85828 Personal history of other malignant neoplasm of skin: Secondary | ICD-10-CM | POA: Diagnosis not present

## 2015-06-10 DIAGNOSIS — G43909 Migraine, unspecified, not intractable, without status migrainosus: Secondary | ICD-10-CM | POA: Insufficient documentation

## 2015-06-10 DIAGNOSIS — Y9389 Activity, other specified: Secondary | ICD-10-CM | POA: Insufficient documentation

## 2015-06-10 DIAGNOSIS — Z79899 Other long term (current) drug therapy: Secondary | ICD-10-CM | POA: Insufficient documentation

## 2015-06-10 DIAGNOSIS — R109 Unspecified abdominal pain: Secondary | ICD-10-CM
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
## 2015-08-04 ENCOUNTER — Other Ambulatory Visit: Payer: Self-pay | Admitting: Obstetrics and Gynecology

## 2015-08-05 LAB — CYTOLOGY - PAP

## 2015-08-06 ENCOUNTER — Other Ambulatory Visit: Payer: Self-pay | Admitting: Obstetrics and Gynecology

## 2015-08-06 DIAGNOSIS — R928 Other abnormal and inconclusive findings on diagnostic imaging of breast: Secondary | ICD-10-CM

## 2015-08-11 ENCOUNTER — Ambulatory Visit
Admission: RE | Admit: 2015-08-11 | Discharge: 2015-08-11 | Disposition: A | Discharge status: Home / Self Care | Payer: 59 | Source: Ambulatory Visit | Attending: Obstetrics and Gynecology | Admitting: Obstetrics and Gynecology

## 2015-08-11 DIAGNOSIS — R928 Other abnormal and inconclusive findings on diagnostic imaging of breast: Secondary | ICD-10-CM

## 2016-01-07 ENCOUNTER — Other Ambulatory Visit: Payer: Self-pay | Admitting: Obstetrics and Gynecology

## 2016-01-07 DIAGNOSIS — N6320 Unspecified lump in the left breast, unspecified quadrant: Secondary | ICD-10-CM

## 2016-01-15 ENCOUNTER — Ambulatory Visit
Admission: RE | Admit: 2016-01-15 | Discharge: 2016-01-15 | Disposition: A | Discharge status: Home / Self Care | Payer: 59 | Source: Ambulatory Visit | Attending: Obstetrics and Gynecology | Admitting: Obstetrics and Gynecology

## 2016-01-15 DIAGNOSIS — N6320 Unspecified lump in the left breast, unspecified quadrant: Secondary | ICD-10-CM

## 2016-06-14 ENCOUNTER — Other Ambulatory Visit: Payer: Self-pay | Admitting: Obstetrics and Gynecology

## 2016-06-14 DIAGNOSIS — N6320 Unspecified lump in the left breast, unspecified quadrant: Secondary | ICD-10-CM

## 2016-08-04 ENCOUNTER — Ambulatory Visit
Admission: RE | Admit: 2016-08-04 | Discharge: 2016-08-04 | Disposition: A | Discharge status: Home / Self Care | Payer: 59 | Source: Ambulatory Visit | Attending: Obstetrics and Gynecology | Admitting: Obstetrics and Gynecology

## 2016-08-04 DIAGNOSIS — N6320 Unspecified lump in the left breast, unspecified quadrant: Secondary | ICD-10-CM

## 2017-09-22 ENCOUNTER — Other Ambulatory Visit: Payer: Self-pay | Admitting: Obstetrics and Gynecology

## 2017-09-22 DIAGNOSIS — N6320 Unspecified lump in the left breast, unspecified quadrant: Secondary | ICD-10-CM

## 2017-09-28 ENCOUNTER — Ambulatory Visit
Admission: RE | Admit: 2017-09-28 | Discharge: 2017-09-28 | Disposition: A | Discharge status: Home / Self Care | Payer: 59 | Source: Ambulatory Visit | Attending: Obstetrics and Gynecology | Admitting: Obstetrics and Gynecology

## 2017-09-28 DIAGNOSIS — N6320 Unspecified lump in the left breast, unspecified quadrant: Secondary | ICD-10-CM

## 2023-03-31 ENCOUNTER — Encounter: Payer: Self-pay | Admitting: Obstetrics and Gynecology

## 2023-03-31 ENCOUNTER — Other Ambulatory Visit: Payer: Self-pay | Admitting: Obstetrics and Gynecology

## 2023-03-31 DIAGNOSIS — Z9189 Other specified personal risk factors, not elsewhere classified: Secondary | ICD-10-CM

## 2023-03-31 DIAGNOSIS — C801 Malignant (primary) neoplasm, unspecified: Secondary | ICD-10-CM
